# Patient Record
Sex: Male | Born: 1935 | Race: White | Hispanic: No | Marital: Married | Smoking: Former smoker
Health system: Southern US, Community
[De-identification: ages and names within clinical notes are randomized; demographics above are authoritative.]

## PROBLEM LIST (undated history)

## (undated) DIAGNOSIS — I6529 Occlusion and stenosis of unspecified carotid artery: Secondary | ICD-10-CM

## (undated) DIAGNOSIS — R209 Unspecified disturbances of skin sensation: Secondary | ICD-10-CM

## (undated) DIAGNOSIS — K219 Gastro-esophageal reflux disease without esophagitis: Secondary | ICD-10-CM

## (undated) DIAGNOSIS — I951 Orthostatic hypotension: Secondary | ICD-10-CM

## (undated) DIAGNOSIS — N200 Calculus of kidney: Secondary | ICD-10-CM

## (undated) DIAGNOSIS — M109 Gout, unspecified: Secondary | ICD-10-CM

## (undated) DIAGNOSIS — M7040 Prepatellar bursitis, unspecified knee: Secondary | ICD-10-CM

## (undated) DIAGNOSIS — J3489 Other specified disorders of nose and nasal sinuses: Secondary | ICD-10-CM

## (undated) DIAGNOSIS — I2510 Atherosclerotic heart disease of native coronary artery without angina pectoris: Secondary | ICD-10-CM

## (undated) DIAGNOSIS — R42 Dizziness and giddiness: Secondary | ICD-10-CM

## (undated) DIAGNOSIS — N183 Chronic kidney disease, stage 3 (moderate): Secondary | ICD-10-CM

## (undated) DIAGNOSIS — I739 Peripheral vascular disease, unspecified: Secondary | ICD-10-CM

## (undated) DIAGNOSIS — J8410 Pulmonary fibrosis, unspecified: Secondary | ICD-10-CM

## (undated) DIAGNOSIS — E079 Disorder of thyroid, unspecified: Secondary | ICD-10-CM

## (undated) DIAGNOSIS — Z96649 Presence of unspecified artificial hip joint: Secondary | ICD-10-CM

## (undated) DIAGNOSIS — Q674 Other congenital deformities of skull, face and jaw: Secondary | ICD-10-CM

## (undated) DIAGNOSIS — I209 Angina pectoris, unspecified: Secondary | ICD-10-CM

## (undated) DIAGNOSIS — M79609 Pain in unspecified limb: Secondary | ICD-10-CM

## (undated) DIAGNOSIS — R072 Precordial pain: Secondary | ICD-10-CM

## (undated) DIAGNOSIS — I10 Essential (primary) hypertension: Secondary | ICD-10-CM

## (undated) DIAGNOSIS — G4700 Insomnia, unspecified: Secondary | ICD-10-CM

## (undated) DIAGNOSIS — C801 Malignant (primary) neoplasm, unspecified: Secondary | ICD-10-CM

## (undated) DIAGNOSIS — Z87898 Personal history of other specified conditions: Secondary | ICD-10-CM

## (undated) DIAGNOSIS — E785 Hyperlipidemia, unspecified: Secondary | ICD-10-CM

## (undated) DIAGNOSIS — D649 Anemia, unspecified: Secondary | ICD-10-CM

## (undated) HISTORY — DX: Angina pectoris, unspecified: I20.9

## (undated) HISTORY — DX: Personal history of other specified conditions: Z87.898

## (undated) HISTORY — DX: Unspecified disturbances of skin sensation: R20.9

## (undated) HISTORY — DX: Pain in unspecified limb: M79.609

## (undated) HISTORY — DX: Other specified disorders of nose and nasal sinuses: J34.89

## (undated) HISTORY — DX: Calculus of kidney: N20.0

## (undated) HISTORY — DX: Orthostatic hypotension: I95.1

## (undated) HISTORY — DX: Atherosclerotic heart disease of native coronary artery without angina pectoris: I25.10

## (undated) HISTORY — DX: Presence of unspecified artificial hip joint: Z96.649

## (undated) HISTORY — DX: Chronic kidney disease, stage 3 (moderate): N18.3

## (undated) HISTORY — DX: Precordial pain: R07.2

## (undated) HISTORY — DX: Pulmonary fibrosis, unspecified: J84.10

## (undated) HISTORY — DX: Gout, unspecified: M10.9

## (undated) HISTORY — DX: Peripheral vascular disease, unspecified: I73.9

## (undated) HISTORY — DX: Other congenital deformities of skull, face and jaw: Q67.4

## (undated) HISTORY — DX: Occlusion and stenosis of unspecified carotid artery: I65.29

## (undated) HISTORY — DX: Other disorders of calcium metabolism: E83.59

## (undated) HISTORY — DX: Hyperlipidemia, unspecified: E78.5

## (undated) HISTORY — DX: Gastro-esophageal reflux disease without esophagitis: K21.9

## (undated) HISTORY — DX: Dizziness and giddiness: R42

## (undated) HISTORY — DX: Insomnia, unspecified: G47.00

## (undated) HISTORY — DX: Disorder of thyroid, unspecified: E07.9

## (undated) HISTORY — DX: Prepatellar bursitis, unspecified knee: M70.40

## (undated) HISTORY — DX: Anemia, unspecified: D64.9

## (undated) HISTORY — DX: Essential (primary) hypertension: I10

---

## 1978-08-23 HISTORY — PX: TOTAL HIP ARTHROPLASTY: SHX124

## 1979-03-29 DIAGNOSIS — Z96649 Presence of unspecified artificial hip joint: Secondary | ICD-10-CM

## 1979-03-29 DIAGNOSIS — N200 Calculus of kidney: Secondary | ICD-10-CM

## 1979-03-29 HISTORY — DX: Presence of unspecified artificial hip joint: Z96.649

## 1979-03-29 HISTORY — DX: Calculus of kidney: N20.0

## 1992-08-23 HISTORY — PX: CORONARY ARTERY BYPASS GRAFT: SHX141

## 1993-03-28 DIAGNOSIS — I2510 Atherosclerotic heart disease of native coronary artery without angina pectoris: Secondary | ICD-10-CM

## 1993-03-28 HISTORY — DX: Atherosclerotic heart disease of native coronary artery without angina pectoris: I25.10

## 2001-11-30 ENCOUNTER — Encounter: Payer: Self-pay | Admitting: Internal Medicine

## 2001-11-30 ENCOUNTER — Encounter: Admission: RE | Admit: 2001-11-30 | Discharge: 2001-11-30 | Payer: Self-pay | Admitting: Internal Medicine

## 2001-12-14 ENCOUNTER — Encounter: Admission: RE | Admit: 2001-12-14 | Discharge: 2002-03-14 | Payer: Self-pay | Admitting: Internal Medicine

## 2002-04-13 ENCOUNTER — Encounter: Payer: Self-pay | Admitting: Emergency Medicine

## 2002-04-13 ENCOUNTER — Inpatient Hospital Stay (HOSPITAL_COMMUNITY): Admission: EM | Admit: 2002-04-13 | Discharge: 2002-04-14 | Payer: Self-pay | Admitting: Emergency Medicine

## 2003-08-14 ENCOUNTER — Emergency Department (HOSPITAL_COMMUNITY): Admission: EM | Admit: 2003-08-14 | Discharge: 2003-08-14 | Payer: Self-pay | Admitting: Emergency Medicine

## 2003-08-26 ENCOUNTER — Ambulatory Visit (HOSPITAL_COMMUNITY): Admission: RE | Admit: 2003-08-26 | Discharge: 2003-08-26 | Payer: Self-pay | Admitting: Cardiovascular Disease

## 2003-12-02 ENCOUNTER — Encounter: Admission: RE | Admit: 2003-12-02 | Discharge: 2003-12-02 | Payer: Self-pay | Admitting: *Deleted

## 2005-01-06 ENCOUNTER — Inpatient Hospital Stay (HOSPITAL_COMMUNITY): Admission: RE | Admit: 2005-01-06 | Discharge: 2005-01-09 | Payer: Self-pay | Admitting: Orthopaedic Surgery

## 2005-11-19 ENCOUNTER — Emergency Department (HOSPITAL_COMMUNITY): Admission: EM | Admit: 2005-11-19 | Discharge: 2005-11-19 | Payer: Self-pay | Admitting: Family Medicine

## 2007-02-09 ENCOUNTER — Encounter: Admission: RE | Admit: 2007-02-09 | Discharge: 2007-02-09 | Payer: Self-pay | Admitting: Internal Medicine

## 2007-02-23 ENCOUNTER — Ambulatory Visit: Payer: Self-pay | Admitting: Internal Medicine

## 2007-02-27 ENCOUNTER — Ambulatory Visit: Payer: Self-pay | Admitting: Cardiovascular Disease

## 2009-05-14 ENCOUNTER — Ambulatory Visit (HOSPITAL_COMMUNITY): Admission: RE | Admit: 2009-05-14 | Discharge: 2009-05-14 | Payer: Self-pay | Admitting: Cardiology

## 2009-05-14 ENCOUNTER — Encounter (INDEPENDENT_AMBULATORY_CARE_PROVIDER_SITE_OTHER): Payer: Self-pay | Admitting: Cardiology

## 2009-05-14 ENCOUNTER — Ambulatory Visit: Payer: Self-pay | Admitting: Cardiovascular Disease

## 2009-05-14 ENCOUNTER — Inpatient Hospital Stay (HOSPITAL_COMMUNITY): Admission: AD | Admit: 2009-05-14 | Discharge: 2009-05-17 | Payer: Self-pay | Admitting: Cardiology

## 2009-05-14 DIAGNOSIS — I209 Angina pectoris, unspecified: Secondary | ICD-10-CM

## 2009-05-14 HISTORY — DX: Angina pectoris, unspecified: I20.9

## 2009-05-16 HISTORY — PX: CORONARY ANGIOPLASTY WITH STENT PLACEMENT: SHX49

## 2009-10-23 DIAGNOSIS — R42 Dizziness and giddiness: Secondary | ICD-10-CM

## 2009-10-23 HISTORY — DX: Dizziness and giddiness: R42

## 2009-10-27 ENCOUNTER — Emergency Department (HOSPITAL_COMMUNITY): Admission: EM | Admit: 2009-10-27 | Discharge: 2009-10-27 | Payer: Self-pay | Admitting: Emergency Medicine

## 2009-10-27 DIAGNOSIS — I951 Orthostatic hypotension: Secondary | ICD-10-CM

## 2009-10-27 HISTORY — DX: Orthostatic hypotension: I95.1

## 2010-11-15 LAB — BASIC METABOLIC PANEL
BUN: 28 mg/dL — ABNORMAL HIGH (ref 6–23)
CO2: 28 mEq/L (ref 19–32)
Calcium: 9.4 mg/dL (ref 8.4–10.5)
Chloride: 99 mEq/L (ref 96–112)
GFR calc non Af Amer: 43 mL/min — ABNORMAL LOW (ref >60)
Glucose, Bld: 92 mg/dL (ref 70–99)

## 2010-11-15 LAB — DIFFERENTIAL
Basophils Relative: 0 % (ref 0–1)
Eosinophils Absolute: 0.4 10*3/uL (ref 0.0–0.7)
Monocytes Absolute: 1.7 10*3/uL — ABNORMAL HIGH (ref 0.1–1.0)
Neutro Abs: 5 10*3/uL (ref 1.7–7.7)

## 2010-11-15 LAB — CBC
HCT: 36.6 % — ABNORMAL LOW (ref 39.0–52.0)
Hemoglobin: 12.5 g/dL — ABNORMAL LOW (ref 13.0–17.0)
RBC: 3.95 MIL/uL — ABNORMAL LOW (ref 4.22–5.81)
RDW: 15.1 % (ref 11.5–15.5)

## 2010-11-15 LAB — POCT CARDIAC MARKERS
Myoglobin, poc: 79.9 ng/mL (ref 12–200)
Troponin i, poc: <0.05 ng/mL (ref 0.00–0.09)

## 2010-11-27 LAB — BASIC METABOLIC PANEL
BUN: 24 mg/dL — ABNORMAL HIGH (ref 6–23)
CO2: 26 mEq/L (ref 19–32)
Calcium: 8.8 mg/dL (ref 8.4–10.5)
Calcium: 8.8 mg/dL (ref 8.4–10.5)
Chloride: 105 mEq/L (ref 96–112)
Chloride: 105 mEq/L (ref 96–112)
Creatinine, Ser: 1.59 mg/dL — ABNORMAL HIGH (ref 0.4–1.5)
Creatinine, Ser: 1.83 mg/dL — ABNORMAL HIGH (ref 0.4–1.5)
GFR calc Af Amer: 51 mL/min — ABNORMAL LOW (ref >60)
GFR calc non Af Amer: 42 mL/min — ABNORMAL LOW (ref >60)
GFR calc non Af Amer: 42 mL/min — ABNORMAL LOW (ref >60)
Glucose, Bld: 110 mg/dL — ABNORMAL HIGH (ref 70–99)
Glucose, Bld: 115 mg/dL — ABNORMAL HIGH (ref 70–99)
Glucose, Bld: 92 mg/dL (ref 70–99)
Sodium: 137 mEq/L (ref 135–145)
Sodium: 141 mEq/L (ref 135–145)

## 2010-11-27 LAB — CBC
HCT: 33.2 % — ABNORMAL LOW (ref 39.0–52.0)
Hemoglobin: 10.4 g/dL — ABNORMAL LOW (ref 13.0–17.0)
MCV: 98.2 fL (ref 78.0–100.0)
Platelets: 169 10*3/uL (ref 150–400)
Platelets: 190 10*3/uL (ref 150–400)
RBC: 3.07 MIL/uL — ABNORMAL LOW (ref 4.22–5.81)
RDW: 14.2 % (ref 11.5–15.5)

## 2010-11-27 LAB — APTT: aPTT: 24 seconds (ref 24–37)

## 2010-11-27 LAB — PROTIME-INR: INR: 1 (ref 0.00–1.49)

## 2011-01-05 NOTE — Assessment & Plan Note (Signed)
[facility]                             PULMONARY OFFICE NOTE   NAME:Luis Barker, Luis Barker                   MRN:          [id number]  DATE:02/23/2007                            DOB:          1925/10/12    HISTORY:  This is a 77 year old white male with a chronic cough and  dyspnea dating back about five years, for which he underwent a chest x-  ray suggesting mild fibrotic changes in the left greater than right face  and was seen at Dr. Carolee Rota request. He denies any myalgias,  arthralgias, or change in his symptoms over the last several years, or  previous exposure to asbestos, known pulmonary toxins, or Amiodarone.   PAST MEDICAL HISTORY:  Significant for hypertension, hyperlipidemia, and  remote ischemic heart disease status post remote CABG.   ALLERGIES:  SULFA causes rash with itching.   MEDICATIONS:  1. Lipitor.  2. Benicar.  3. Vitamin D.  4. Aspirin.  5. Fish oil.  6. Multivitamins.   Please see face sheet column dated 02/23/2007 for details.   SOCIAL HISTORY:  He quit smoking in 1960. He is retired with no usual  travel, pet, or hobby exposure.   FAMILY HISTORY:  Negative for respiratory disease or rheumatologic  disorders, and is taken in detail on the worksheet with no additional  findings.   REVIEW OF SYSTEMS:  Also taken in detail on the worksheet with  additional complaints.   PHYSICAL EXAMINATION:  GENERAL:  This is a pleasant stoic ambulatory  white male in no acute distress.  VITAL SIGNS:  Stable.  HEENT:  Unremarkable. Oropharynx is clear. Dentition is intact. Nasal  turbinates normal. Ear canals clear bilaterally.  NECK:  Supple without cervical adenopathy, trachea is midline, no  thyromegaly.  LUNGS:  Perfectly clear bilaterally to auscultation except for subtle  crackles in the left greater than right base. There is no cough however  on inspiratory maneuvers.  CARDIAC:  Regular rate and rhythm with no murmurs, rubs,  or gallops.  ABDOMEN:  Soft, benign with no palpable organomegaly, mass, or  tenderness.  EXTREMITIES:  Warm without calf tenderness, cyanosis, clubbing, or  edema.   Hemoglobin saturation was 97% on room air.   Chest x-ray from 6/16 showed cardiomegaly with minimal increase of  markings at the base.   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 He has no clubbing or  progressive symptoms to suggest UIP or rheumatologic symptoms to suggest  collagen vascular disease.   I did recommend a CT scan of the chest and PFTs to quantify the  problem, and will probably need to repeat both the plain film and PFTs  at six months to make sure there has been no progression.   I had a long discussion with the patient about the findings, but  emphasized that it is very unlikely he has the typical form of pulmonary  fibrosis that  would portend a significant impact on his longevity, or  for that matter, quality of life.     Charlaine Dalton. Sherene Sires, MD, [facility]  Electronically Signed    MBW/MedQ  DD: 02/23/2007  DT: 02/24/2007  Job #: [id number]   cc:   Soyla Murphy. Renne Crigler, M.D.

## 2011-01-08 NOTE — Discharge Summary (Signed)
NAME:  Luis Barker, Luis Barker            ACCOUNT NO.:  [id number]   MEDICAL RECORD NO.:  [id number]          PATIENT TYPE:  INP   LOCATION:  5022                         FACILITY:  [facility]   PHYSICIAN:  Mark C. Ophelia Charter, M.D.    DATE OF BIRTH:  05-Nov-1925   DATE OF ADMISSION:  01/06/2005  DATE OF DISCHARGE:  01/09/2005                                 DISCHARGE SUMMARY   FINAL DIAGNOSIS:  Painful, loose total hip arthroplasty with polyethylene  debris, a loop recorder left chest wall.   PROCEDURE:  Left total hip arthroplasty revision, poly and ball change and  removal of left chest subcutaneous loop recorder. Surgeon was [employer]. Ophelia Charter,  M.D. Anesthesia was GOT, 100 cc.   This 77 year old male had left total hip arthroplasty done in 1991 with  polyethylene wear which was followed for the last five years treated with  Fosamax. He has had intermittent symptoms with progression by radiographs of  polyethylene wear with prosthesis and polymer. He was brought in for  polyethylene and ball exchange. I was asked by the patient's cardiologist,  Dr. Aleen Campi, to remove the loop recorder while he is under general  anesthesia which is the battery powered implantable Holter monitor type  device that had a dead battery and showed no arrhythmia problems.  This was  at his cardiologist's request. The patient was permitted for this after  appropriate hospital administration was notified of the planned procedure at  the request of cardiologist. The patient's preoperative labs included normal  CBC. PT and PTT were normal.  BUN was 24, creatinine 1.7.  The patient had  some borderline creatinine elevation in the past. He was taken to the  operating room and underwent revision of the polyethylene liner and ball,  followed by closure and then removal of the chest loop recording device.  Postoperatively he was seen by OT, PT, care management, pharmacy, and DVT  prophylaxis. He made excellent progress and had no  problems with the chest  wall or hip incisions, and was discharged on Jan 09, 2005, and also to  follow up in one week with Dr. Ophelia Charter for suture removal.   CONDITION ON DISCHARGE:  Stable.       MCY/MEDQ  D:  03/28/2005  T:  03/28/2005  Job:  [id number]

## 2011-01-08 NOTE — H&P (Signed)
NAME:  Luis Barker, Luis Barker                      ACCOUNT NO.:  [id number]   MEDICAL RECORD NO.:  [id number]                   PATIENT TYPE:  EMS   LOCATION:  MAJO                                 FACILITY:  [facility]   PHYSICIAN:  Christiana Fuchs, MD             DATE OF BIRTH:  1926-07-30   DATE OF ADMISSION:  04/13/2002  DATE OF DISCHARGE:                                HISTORY & PHYSICAL   CHIEF COMPLAINT:  Syncope.   HISTORY OF PRESENT ILLNESS:  The patient was at a restaurant, had consumed  two alcoholic drinks which is a usual pattern for him, and while sitting at  the table preparing to eat dinner he passed out and next recalls waking in  the ambulance.  Family members who were with the patient during this episode  noticed he became very pale and diaphoretic, closed his eyes and slumped  forward although did not fall to the table.  They report that he remained  sitting in his chair during this episode.  Intermittently he would arch his  neck back and make a deep sound similar to as if he were snoring.  Family  members present clearly state that there were no tonic or clonic movements  identified.  There was no incontinence noted.  The episode of  unresponsiveness lasted for approximately 5-10 minutes and resolved soon  after arrival of EMS at the scene.  A bystander present at the restaurant  had some medical training and was able to feel the patient's pulse and  reported this to be normal throughout this whole episode.   The patient, himself, states that just prior to the development of this  episode of syncope he had an episode of periumbilical pain which he  describes as minor without evidence of radiation.  He noted no palpitation.  There was no shortness of breath.  There was no nausea.  There was no  vomiting.  He also states that there are no prior similar occurrences.  The  EMS records show that the patient had good vitals upon their arrival at the  scene with a pulse  rate of 70 with sinus rhythm showing on the monitor.  The  patient's BP was noted to be 126/64.  It appears the patient was  uncommunicative during his stay at the restaurant throughout this episode  and began to communicate with EMS personnel during the ambulance ride to the  hospital.  Presently he is alert and oriented and is without any complaints.   The patient states that his only difference in his medications was he  started a new medication today.  He reports this to be Restoril 15 mg which  he took at approximately 2 p.m.  The event itself occurred at approximately  7:30 to 7:45 p.m.  In the intervening time he did have two glasses of  alcohol.   PAST MEDICAL HISTORY:  1. 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  2. Osteoporosis.  3. Coronary artery disease.  4. Status post coronary artery bypass grafting.  5. Status post left hip replacement greater than 10 years ago.   MEDICATIONS:  1. Lipitor 10 mg p.o. q.d.  2. Fosamax 70 mg p.o. q.weekly.  3. Naproxen 500 mg p.o. q.d.  4. Restoril 15 mg 1 tablet taken today.   SOCIAL HISTORY:  The patient does not smoke.  He takes an occasional  alcoholic drink.  He walks approximately one and one-fourth miles six days  of the week.   ALLERGIES:  SULFA DRUGS.   PHYSICAL EXAMINATION:  VITAL SIGNS:  The patient is presently afebrile with  a BP of 126/64, pulse rate was 70 with a respiratory rate of 14.  Capillary  blood glucose was 127.  HEENT:  Pupils equal, round and reactive to light and accommodation.  Extraocular movements were intact.  Mucosa are moist.  NECK:  Supple without evidence of JVD or bruit.  CARDIAC:  S1 and S2 are normally heard.  There is no rub, gallop or murmur.  CHEST:  Luis Barker to auscultation.  There is some decreased air movement with  occasional crackles heard in the left lower lobe.  ABDOMEN:  Benign.  There is no abdominal tenderness, rigidity, guarding or  rebound.  Bowel sounds are  normally heard.  EXTREMITIES:  No pedal edema.  NEUROLOGIC:  Cranial nerves are intact.  Plantars are down going  bilaterally.  The patient has 5/5 motor strength in all extremities.  The  patient is fluent.   LABORATORY DATA:  CBC reveals white count of 8.9, hemoglobin 13.8.  Basic  metabolic panel reveals sodium 161, creatinine 1.5, potassium 4.2.  CK MB is  found to be 1.7 with troponin of 0.00.   RADIOLOGICAL INVESTIGATIONS:  Chest x-ray Luis Barker; final report pending.   EKG:  Right bundle branch block.  No old one to compare.   ASSESSMENT/PLAN:  Syncope.  Most likely multifactorial secondary to a  combination of alcohol combined with vasovagal episode and initial  unexpected response to Restoril.  However, in the presence of known coronary  artery disease as well as this vague abdominal pain in the periumbilical  region, will need to rule out for any cardiac cause of this.  Will check  serial cardiac enzymes as well as troponin.  Will check EKG or repeat EKG in  the morning.  If this initial workup is negative, will consider discharge to  home with further evaluation with an outpatient stress test.  Will initiate  treatment using aspirin 325 mg.  Will consider addition of beta blocker in a  patient with known coronary artery disease once the patient's stress test is  completed.  Will need to consider use of ACE inhibitor if the patient does  have events of any impaired ejection fraction.                                               Christiana Fuchs, MD    AR/MEDQ  D:  04/13/2002  T:  04/16/2002  Job:  [id number]

## 2011-01-08 NOTE — Discharge Summary (Signed)
NAME:  Luis Barker, Luis Barker NO.:  [id number]   MEDICAL RECORD NO.:  [id number]                   PATIENT TYPE:  INP   LOCATION:  4742                                 FACILITY:  [facility]   PHYSICIAN:  Juline Patch, MD                    DATE OF BIRTH:  26-Jul-1926   DATE OF ADMISSION:  04/13/2002  DATE OF DISCHARGE:  04/14/2002                                 DISCHARGE SUMMARY   HISTORY OF PRESENT ILLNESS:  This is a 77 year old white male who was  admitted by Eliezer Champagne, M.D.,  on April 13, 2002, for an episode  of syncope after eating dinner at a restaurant.  The patient stated that he  passed out after feeling an upset stomach.  He denies any chest pain.  The  patient has a history of coronary artery bypass graft and is followed by Dr.  Donnie Aho.  The patient denies any palpitations, shortness of breath, nausea or  vomiting.  He passed out for five to 10 minutes and woke up in the emergency  room.  There was no tonic clonic movements or incontinence. He was not  confused after the episode.  The patient states that he did not drink a lot  of fluids that day.  He drank two glasses of alcohol during dinner and also  was started on Flomax for the first time that day.   The patient was admitted to telemetry and had CIP and troponins that ruled  out for MI.  He was observed on telemetry where his average heart rate was  in the 50s and 60s.  He denied any further chest pain and nitroglycerin  patch was removed from his chest four hours prior to discharge where he did  not have any chest pain.  On EKG there was a right bundle branch block and  it is questioned whether this is old or new.  His other laboratory values  were within normal limits, however, he did have a creatinine of 1.5 and we  are not sure if this is a new finding.   DISCHARGE MEDICATIONS:  1. Lipitor 10 mg.  2. Enteric coated aspirin 325 mg once a day.  3. Nitroglycerin sublingual p.r.n.  as needed.   DISCHARGE INSTRUCTIONS:  We encouraged him to have increased fluid intake  and not to drive until seen by [place] in our office this week.  [place] Tylenol for  pain if warranted and having increasing reflux he should see [place] in the  office for a trial of PPI or H2 blockers.  I instructed him to follow up  with Victorino Dike ______, or Dr. Lendell Caprice this week and if indicated, he may  need a stress test.  However, he did have a treadmill stress test six months  ago and had no ischemic changes.   DISCHARGE DIAGNOSIS:  Syncope secondary to multiple causes:  dehydration,  Flomax, and  vasovagal.                                               Juline Patch, MD    RP/MEDQ  D:  04/14/2002  T:  04/17/2002  Job:  [id number]   cc:   W. Ashley Royalty., M.D.   Sharlotte Alamo, M.D.  Anette Guarneri

## 2011-01-08 NOTE — Cardiovascular Report (Signed)
NAME:  Luis Barker, Luis Barker                      ACCOUNT NO.:  [id number]   MEDICAL RECORD NO.:  [id number]                   PATIENT TYPE:  OIB   LOCATION:  2854                                 FACILITY:  [facility]   PHYSICIAN:  Aram Candela. Tysinger, M.D.              DATE OF BIRTH:  [birth year]   DATE OF PROCEDURE:  08/26/2003  DATE OF DISCHARGE:                              CARDIAC CATHETERIZATION   PROCEDURES:  Insertion of loop recorder.   INDICATION FOR PROCEDURES:  Syncope x2.   PROCEDURE:  After signing an informed consent, the patient was premedicated  with 5 mg of Valium by mouth and brought to the cardiac catheterization lab.  His anterior chest and base of neck were prepped and draped in sterile  fashion and a left parasternal transverse area was anesthetized locally with  1% lidocaine.  An incision was made in this anesthetized plane with the  incision being deepened into the fascial layer.  A small pocket was then  formed for insertion of the loop recorder which was then inserted without  difficulty.  It was sutured in place using 2-0 silk.  After the loop  recorder was in place, the wound was closed in layers using 2-0 Dexon.  Final skin closure was obtained with a cutaneous layer of Steri-Strips.  The  patient tolerated the procedure well and no complications were noted at the  end of the procedure.  A sterile bulky dressing was applied to the wound and  he was returned to the short stay unit for monitoring and one further dose  of IV antibiotic before discharge later today.                                               John R. Aleen Campi, M.D.    JRT/MEDQ  D:  08/26/2003  T:  08/26/2003  Job:  [id number]

## 2011-01-08 NOTE — Op Note (Signed)
NAME:  Luis Barker, Luis Barker            ACCOUNT NO.:  [id number]   MEDICAL RECORD NO.:  [id number]          PATIENT TYPE:  INP   LOCATION:  5022                         FACILITY:  [facility]   PHYSICIAN:  Mark C. Ophelia Charter, M.D.    DATE OF BIRTH:  1926/04/08   DATE OF PROCEDURE:  01/06/2005  DATE OF DISCHARGE:  01/09/2005                                 OPERATIVE REPORT   PREOPERATIVE DIAGNOSES:  1.  Left painful total hip arthroplasty with polyethylene debris.  2.  Painful loop recorder in chest wall with dead battery.   OPERATION/PROCEDURE:  1.  Left total hip arthroplasty revision, polyethylene liner and ball      exchange.  2.  Removal of left chest wall loop subcutaneous recorder.   SURGEON:  Mark C. Ophelia Charter, M.D.   ANESTHESIA:  General.   ESTIMATED BLOOD LOSS:  100 mL.   INDICATIONS:  This 77 year old male had been a patient of mine for many  years and had total hip arthroplasty done in 1991.  He has had progressive  polyethylene wear and treated with Fosamax and has had increased pain with  eccentric wear.  In the meantime he had problems with arrhythmia.  Had  Holter monitor which was negative and then had implanted loop recorder  device in the chest wall which is ready for removal.  Dr. Aleen Campi, his  cardiologist, asked if I could remove this at the time of his arthroplasty  to prevent a second procedure for the patient.   DESCRIPTION OF PROCEDURE:  After induction of general anesthesia orotracheal  intubation, the patient was placed in the lateral position.  The left hip  was prepped and draped in the usual fashion with preoperative antibiotics.  The old incision was opened.  Posterior approach was made.  There was  significant tissue present from the eccentric polyethylene wear, typical in  appearance which was suctioned.  Cultures were obtained. There was no  evidence of infection.  Polyethylene showed eccentric wear.  Polyethylene  was removed from the liner and a new liner  and a new ball was placed.  Stability was checked.  There was excellent hip stability.  Piriformis was  repaired. Tensor fascia was repaired with non-absorbable sutures, 0 Vicryl  in the gluteus maximum fascia, 2-0 subcutaneous tissue, skin stapled  closure.   The patient was then transferred to the supine position.   Chest was prepped over the loop recorder and the old scar was opened after  area had been squared with towels.  Sterile skin marker was used on the  skin.  Once the skin was opened, the loop recorder popped out.  The wound  was irrigated and subcutaneous tissue was irrigated and then closed with  good hemostasis.   The patient had a knee immobilizer applied.  Leg lengths were equal.  He was  transferred to the recovery room where x-ray demonstrated good position of  the hip.      Mark C. Ophelia Charter, M.D.  Electronically Signed     MCY/MEDQ  D:  04/21/2005  T:  04/22/2005  Job:  [id number]

## 2012-02-04 DIAGNOSIS — M7040 Prepatellar bursitis, unspecified knee: Secondary | ICD-10-CM

## 2012-02-04 HISTORY — DX: Prepatellar bursitis, unspecified knee: M70.40

## 2012-03-27 DIAGNOSIS — D649 Anemia, unspecified: Secondary | ICD-10-CM

## 2012-03-27 DIAGNOSIS — R072 Precordial pain: Secondary | ICD-10-CM

## 2012-03-27 DIAGNOSIS — E785 Hyperlipidemia, unspecified: Secondary | ICD-10-CM

## 2012-03-27 DIAGNOSIS — J8410 Pulmonary fibrosis, unspecified: Secondary | ICD-10-CM

## 2012-03-27 DIAGNOSIS — N183 Chronic kidney disease, stage 3 unspecified: Secondary | ICD-10-CM

## 2012-03-27 DIAGNOSIS — I10 Essential (primary) hypertension: Secondary | ICD-10-CM | POA: Insufficient documentation

## 2012-03-27 HISTORY — DX: Hyperlipidemia, unspecified: E78.5

## 2012-03-27 HISTORY — DX: Pulmonary fibrosis, unspecified: J84.10

## 2012-03-27 HISTORY — DX: Chronic kidney disease, stage 3 unspecified: N18.30

## 2012-03-27 HISTORY — DX: Essential (primary) hypertension: I10

## 2012-03-27 HISTORY — DX: Precordial pain: R07.2

## 2012-03-27 HISTORY — DX: Anemia, unspecified: D64.9

## 2012-03-28 DIAGNOSIS — M79609 Pain in unspecified limb: Secondary | ICD-10-CM

## 2012-03-28 DIAGNOSIS — E079 Disorder of thyroid, unspecified: Secondary | ICD-10-CM

## 2012-03-28 DIAGNOSIS — M109 Gout, unspecified: Secondary | ICD-10-CM

## 2012-03-28 HISTORY — DX: Pain in unspecified limb: M79.609

## 2012-03-28 HISTORY — DX: Other disorders of calcium metabolism: E83.59

## 2012-03-28 HISTORY — DX: Gout, unspecified: M10.9

## 2012-03-28 HISTORY — DX: Disorder of thyroid, unspecified: E07.9

## 2012-05-16 DIAGNOSIS — I6529 Occlusion and stenosis of unspecified carotid artery: Secondary | ICD-10-CM

## 2012-05-16 HISTORY — DX: Occlusion and stenosis of unspecified carotid artery: I65.29

## 2012-08-01 ENCOUNTER — Other Ambulatory Visit: Payer: Self-pay | Admitting: Internal Medicine

## 2012-08-01 DIAGNOSIS — I739 Peripheral vascular disease, unspecified: Secondary | ICD-10-CM | POA: Insufficient documentation

## 2012-08-01 DIAGNOSIS — M545 Low back pain: Secondary | ICD-10-CM

## 2012-08-01 DIAGNOSIS — R209 Unspecified disturbances of skin sensation: Secondary | ICD-10-CM | POA: Insufficient documentation

## 2012-08-01 DIAGNOSIS — G4700 Insomnia, unspecified: Secondary | ICD-10-CM | POA: Insufficient documentation

## 2012-08-01 HISTORY — DX: Insomnia, unspecified: G47.00

## 2012-08-01 HISTORY — DX: Unspecified disturbances of skin sensation: R20.9

## 2012-08-01 HISTORY — DX: Peripheral vascular disease, unspecified: I73.9

## 2012-08-07 ENCOUNTER — Other Ambulatory Visit: Payer: Self-pay

## 2012-08-07 ENCOUNTER — Ambulatory Visit
Admission: RE | Admit: 2012-08-07 | Discharge: 2012-08-07 | Disposition: A | Discharge status: Home / Self Care | Payer: Medicare Other | Source: Ambulatory Visit | Attending: Internal Medicine | Admitting: Internal Medicine

## 2012-08-07 DIAGNOSIS — M545 Low back pain: Secondary | ICD-10-CM

## 2012-08-09 ENCOUNTER — Other Ambulatory Visit: Payer: Self-pay

## 2012-12-26 ENCOUNTER — Encounter: Payer: Self-pay | Admitting: Internal Medicine

## 2012-12-26 ENCOUNTER — Non-Acute Institutional Stay: Payer: Medicare Other | Admitting: Internal Medicine

## 2012-12-26 VITALS — BP 110/58 | HR 60 | Wt 168.0 lb

## 2012-12-26 DIAGNOSIS — I739 Peripheral vascular disease, unspecified: Secondary | ICD-10-CM

## 2012-12-26 DIAGNOSIS — R209 Unspecified disturbances of skin sensation: Secondary | ICD-10-CM

## 2012-12-26 DIAGNOSIS — I10 Essential (primary) hypertension: Secondary | ICD-10-CM

## 2012-12-26 DIAGNOSIS — G4700 Insomnia, unspecified: Secondary | ICD-10-CM

## 2012-12-26 DIAGNOSIS — E785 Hyperlipidemia, unspecified: Secondary | ICD-10-CM

## 2012-12-26 NOTE — Addendum Note (Signed)
Addended by: Kimber Relic on: 12/26/2012 10:27 AM   Modules accepted: Orders

## 2012-12-26 NOTE — Patient Instructions (Signed)
Stop amlodipine until next visit.

## 2012-12-26 NOTE — Progress Notes (Signed)
Subjective:    Patient ID: Luis Barker, male    DOB: June 28, 1926, 79 y.o.   MRN: [id number]  HPI Peripheral vascular disease, unspecified  ABI testing was normal in December 2013 Insomnia, unspecified  Continues to have issues with not getting enough sleep. He goes to bed about 9 PM . Patient says he falls asleep fine. He then gets up about midnight to 1 AM to void. He is able to fall back asleep within 30-45 minutes. He sleeps until 6 AM. Disturbance of skin sensation  Paresthesias of the scan are improved. 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 Today it was 110/58. He denies dizziness with standing at this point, but does carry a prior diagnosis of orthostatic hypotension. He is taking amlodipine 5 mg.    Review of Systems  Constitutional: Negative for fever, chills, activity change, appetite change and fatigue.  HENT: Negative.   Eyes: Negative.   Respiratory: Positive for shortness of breath.   Cardiovascular: Positive for leg swelling. Negative for chest pain and palpitations.       History of coronary artery disease. Patient had stents placed.  Gastrointestinal: Negative.   Endocrine: Negative.   Genitourinary: Negative.   Musculoskeletal: Positive for back pain.  Skin: Negative.   Allergic/Immunologic: Negative.   Neurological: Negative for tremors, syncope and weakness.       Altered sensation in feet. 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  Hematological: Negative.   Psychiatric/Behavioral: Positive for sleep disturbance.       Objective:   Physical Exam  Constitutional: He is oriented to person, place, and time. He appears well-developed and well-nourished. No distress.  HENT:  Head:  Normocephalic and atraumatic.  Left Ear: External ear normal.  Nose: Nose normal.  Mouth/Throat: No oropharyngeal exudate.  Eyes: Conjunctivae and EOM are normal. Pupils are equal, round, and reactive to light.  Neck: Normal range of motion. Neck supple. No JVD present. No tracheal deviation present. No thyromegaly present.  Cardiovascular: Normal rate, regular rhythm, normal heart sounds and intact distal pulses.  Exam reveals no gallop and no friction rub.   No murmur heard. Pulmonary/Chest: Effort normal and breath sounds normal. No respiratory distress. He has no wheezes. He has no rales. He exhibits no tenderness.  Abdominal: Soft. Bowel sounds are normal. He exhibits no distension and no mass. There is no tenderness.  Musculoskeletal: Normal range of motion. He exhibits edema. He exhibits no tenderness.  Lymphadenopathy:    He has no cervical adenopathy.  Neurological: He is alert and oriented to person, place, and time. He has normal reflexes. No cranial nerve deficit. Coordination normal.  Skin: Skin is warm and dry. No rash noted. No pallor.  Psychiatric: He has a normal mood and affect. His behavior is normal. Judgment and thought content normal.          Assessment & Plan:     ICD-9-CM  1. Peripheral vascular disease, unspecified Patient had strongly palpable posterior tibial pulses bilaterally and a normal ABI in December 2013  443.9  2. Insomnia, unspecified Advised patient that his total hours of sleep are normal for his age. Continue with temazepam as needed.  780.52  3. Disturbance of skin sensation Polyneuropathy sensations of numbness. Sensory changes  have improved since last visit  782.0  4. Other and unspecified hyperlipidemia Followup lab prior to next visit  272.4  5. Unspecified essential hypertension Discontinued amlodipine  401.9

## 2013-01-30 ENCOUNTER — Other Ambulatory Visit: Payer: Self-pay | Admitting: Internal Medicine

## 2013-03-13 ENCOUNTER — Non-Acute Institutional Stay: Payer: Medicare Other | Admitting: Internal Medicine

## 2013-03-13 ENCOUNTER — Encounter: Payer: Self-pay | Admitting: Internal Medicine

## 2013-03-13 VITALS — BP 118/62 | HR 60 | Temp 97.6°F | Ht 67.0 in | Wt 166.0 lb

## 2013-03-13 DIAGNOSIS — H6121 Impacted cerumen, right ear: Secondary | ICD-10-CM

## 2013-03-13 DIAGNOSIS — H6120 Impacted cerumen, unspecified ear: Secondary | ICD-10-CM

## 2013-03-13 NOTE — Progress Notes (Signed)
  Subjective:    Patient ID: Luis Barker, male    DOB: 1925/12/10, 79 y.o.   MRN: [id number]  HPI Right ear stopped up for the last 4 days.   Review of Systems Left EAC normal. Right EAC occluded with cerumen. Hearing is diminished on the right . No recent URI.    Objective:   Physical Exam  Wax occlusion i the right ear      Assessment & Plan:  cereumen impaction.Removed with forceps and water lavage. Forceps nicked the right EAC causing a small amount of bleeding. TM remains intact.  Applied small tissue ball in EAC following lavage. Advised it is OK to remove in a few hours.

## 2013-03-13 NOTE — Patient Instructions (Signed)
Continue current medications.Remove tissue in ear in a few hours.

## 2013-04-30 ENCOUNTER — Encounter: Payer: Self-pay | Admitting: Cardiology

## 2013-04-30 NOTE — Progress Notes (Signed)
Patient ID: Luis Barker, male   DOB: 10/16/1925, 79 y.o.   MRN: [id number]   Luis, Barker  Date of visit:  04/30/2013 DOB:  March 21, 1926    Age:  87 yrs. Medical record number:  [id number]     Account number:  [id number] Primary Care Provider: GREEN III, ARTHUR G ____________________________ CURRENT DIAGNOSES  1. CAD,Native  2. Stent Placement  3. Chronic Kidney Disease (Stage 3)  4. Hypertension,Essential (Benign)  5. Hyperlipidemia  6. Idiopathic Pulmonary Fibrosis  7. Surgery-Aortocoronary Bypass Grafting  8. Dyspnea  9. GERD ____________________________ ALLERGIES  Sulfa (Sulfonamides), Intolerance-unknown ____________________________ MEDICATIONS  1. Multivitamin Tablet, 1 p.o. daily  2. Aspirin Childrens 81 mg Tablet, Chewable, 1 p.o. daily  3. Prednisone 10 mg Tablet, PRN  4. temazepam 15 mg capsule, QHS  5. atorvastatin 80 mg tablet, 1 p.o. daily  6. nitroglycerin 0.4 mg tablet, sublingual, Take as directed ____________________________ CHIEF COMPLAINTS  Followup of CAD,Native ____________________________ HISTORY OF PRESENT ILLNESS  Patient seen for cardiac followup. He has not been seen here in over 2 years when he canceled his last appointment due to his wife's health. He has been doing well although he is severely limited with peripheral neuropathy which affects his gait. He has had no angina and does not need to use nitroglycerin. He is fairly limited in his physical activity due to his neuropathy and gait. He denies PND, orthopnea, syncope, palpitations, or claudication. He is requesting prescription refills today. ____________________________ PAST HISTORY  Past Medical Illnesses:  hyperlipidemia, GERD, hypertension, gout, BPH, pulmonary fibrosis, chronic kidney disease Stage 3, neuropathy;  Cardiovascular Illnesses:  CAD;  Surgical Procedures:  CABG, tonsillectomy/adenoids, hip replacement;  Cardiology Procedures-Invasive:  cardiac cath (left) September 2010, BMS  stent  mid and distal RCA September 2010 Dr. Rosalie Gums, PTCA LAD with restenosis 1998;  Cardiology Procedures-Noninvasive:  echocardiogram September 2010;  Cardiac Cath Results:  normal Left main, 70% stenosis ostial LAD, occluded proximal LAD, 30% stenosis mid CFX, 70% stenosis mid RCA, 99% stenosis distal RCA, widely patent LAD Diag 1 LIMA graft;  LVEF of 65% documented via echocardiogram on 05/14/2009,   ____________________________ CARDIO-PULMONARY TEST DATES EKG Date:  04/30/2013;   Cardiac Cath Date:  05/15/2009;  CABG: 10/14/1995;  Stent Placement Date: 05/16/2009;  Echocardiography Date: 05/14/2009;  Chest Xray Date: 05/14/2009;   ____________________________ FAMILY HISTORY Brother -- Dementia/Alzheimer's, Deceased Father -- Myocardial infarction, Deceased Mother -- CVA, Deceased ____________________________ SOCIAL HISTORY Alcohol Use:  1 oz gin qd;  Smoking:  never smoked;  Diet:  regular diet;  Lifestyle:  married;  Exercise:  walking;  Occupation:  retired Holiday representative;  Residence:  lives with wife [facility];   ____________________________ REVIEW OF SYSTEMS General:  weight loss of approximately 10 lbs Eyes: denies diplopia, glaucoma or visual field defects. Respiratory: mild dyspnea with exertion Cardiovascular:  please review HPI Abdominal: denies dyspepsia, GI bleeding, constipation, or diarrhea Genitourinary-Male: nocturia  Musculoskeletal:  cervical spondylosis Neurological:  peripheral neuropathy, gait disturbance  ____________________________ PHYSICAL EXAMINATION VITAL SIGNS  Blood Pressure:  120/70 Sitting, Left arm, regular cuff   Pulse:  64/min. Weight:  166.00 lbs. Height:  68"BMI: 25  Constitutional:  pleasant white male in no acute distress Skin:  warm and dry to touch, no apparent skin lesions, or masses noted. Head:  normocephalic, normal hair pattern, no masses or tenderness ENT:  ears, nose and throat reveal no gross abnormalities.  Dentition good. Neck:   supple, without massess. No JVD, thyromegaly or carotid bruits. Carotid upstroke  normal. Chest:  healed median sternotomy scar, normal A-P diameter, fine rales both bases Cardiac:  regular rhythm, normal S1 and S2, No S3 or S4, no murmurs, gallops or rubs detected. Peripheral Pulses:  the femoral,dorsalis pedis, and posterior tibial pulses are full and equal bilaterally with no bruits auscultated. Extremities & Back:  1+ edema right leg, bilateral venous insufficiency changes present Neurological:  no gross motor or sensory deficits noted, affect appropriate, oriented x3. ____________________________ MOST RECENT LIPID PANEL 10/09/10  CHOL TOTL 130 mg/dl, LDL 65 NM, HDL 53 mg/dl, TRIGLYCER 62 mg/dl, ALT 19 u/l, ALK PHOS 61 u/l and AST 24 u/l ____________________________ IMPRESSIONS/PLAN  1. Coronary artery disease with previous bypass grafting and previous stents of the right coronary artery with no angina 2. Hyperlipidemia under treatment 3. Stage III chronic kidney disease 4. Severe peripheral neuropathy  Recommendations:  Refills given for atorvastatin as well as nitroglycerin. Clinically having no angina and is mainly limited with neuropathy. Followup in one year. ____________________________ TODAYS ORDERS  1. Return Visit: 1 year  2. 12 Lead EKG: Today                       ____________________________ Cardiology Physician:  Darden Palmer MD [facility]

## 2013-05-08 ENCOUNTER — Non-Acute Institutional Stay: Payer: Medicare Other | Admitting: Internal Medicine

## 2013-05-08 ENCOUNTER — Encounter: Payer: Self-pay | Admitting: Internal Medicine

## 2013-05-08 VITALS — BP 114/62 | HR 72 | Temp 98.0°F | Ht 67.0 in | Wt 166.0 lb

## 2013-05-08 DIAGNOSIS — J8410 Pulmonary fibrosis, unspecified: Secondary | ICD-10-CM

## 2013-05-08 DIAGNOSIS — M109 Gout, unspecified: Secondary | ICD-10-CM

## 2013-05-08 DIAGNOSIS — M25519 Pain in unspecified shoulder: Secondary | ICD-10-CM

## 2013-05-08 DIAGNOSIS — M25511 Pain in right shoulder: Secondary | ICD-10-CM

## 2013-05-08 DIAGNOSIS — I10 Essential (primary) hypertension: Secondary | ICD-10-CM

## 2013-05-08 DIAGNOSIS — G609 Hereditary and idiopathic neuropathy, unspecified: Secondary | ICD-10-CM

## 2013-05-08 DIAGNOSIS — E785 Hyperlipidemia, unspecified: Secondary | ICD-10-CM

## 2013-05-08 DIAGNOSIS — I2510 Atherosclerotic heart disease of native coronary artery without angina pectoris: Secondary | ICD-10-CM

## 2013-05-08 DIAGNOSIS — M11869 Other specified crystal arthropathies, unspecified knee: Secondary | ICD-10-CM

## 2013-05-08 DIAGNOSIS — R351 Nocturia: Secondary | ICD-10-CM

## 2013-05-08 DIAGNOSIS — M11269 Other chondrocalcinosis, unspecified knee: Secondary | ICD-10-CM

## 2013-05-08 NOTE — Progress Notes (Deleted)
  Subjective:    Patient ID: Luis Barker, male    DOB: May 31, 1926, 79 y.o.   MRN: [id number]  HPI    Review of Systems     Objective:   Physical Exam        Assessment & Plan:

## 2013-05-08 NOTE — Progress Notes (Signed)
Passed clock drawing 

## 2013-05-31 ENCOUNTER — Other Ambulatory Visit: Payer: Self-pay | Admitting: *Deleted

## 2013-05-31 MED ORDER — TEMAZEPAM 15 MG PO CAPS: ORAL_CAPSULE | ORAL | Status: DC

## 2013-06-01 ENCOUNTER — Other Ambulatory Visit: Payer: Self-pay | Admitting: *Deleted

## 2013-06-01 MED ORDER — TEMAZEPAM 15 MG PO CAPS: ORAL_CAPSULE | ORAL | Status: DC

## 2013-06-05 ENCOUNTER — Other Ambulatory Visit: Payer: Self-pay | Admitting: *Deleted

## 2013-06-05 MED ORDER — TEMAZEPAM 15 MG PO CAPS: ORAL_CAPSULE | ORAL | Status: DC

## 2013-07-02 NOTE — Progress Notes (Signed)
Patient ID: Luis Barker, male   DOB: 07-03-1926, 79 y.o.   MRN: [id number] Nursing Home Location:  [facility] [address]   Place of Service: Clinic (12)  PCP: Kimber Relic, MD  Code Status: Living Will   Allergies  Allergen Reactions  . Sulfa Antibiotics     Chief Complaint  Patient presents with  . 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 No dysuria  Pain in joint, shoulder region, right: moderate. Difficult to abduct and rotate at right shoulder      Past Medical History  Diagnosis Date  . Peripheral vascular disease, unspecified 08/01/2012  . Insomnia, unspecified 08/01/2012  . Disturbance of skin sensation 08/01/2012  . Occlusion and stenosis of carotid artery 05/16/2012  . Unspecified disorder of thyroid 03/28/2012  . Gout, unspecified 03/28/2012  . Other disorder of calcium metabolism 03/28/2012  . Pain in limb 03/28/2012  . Other and unspecified hyperlipidemia 03/27/2012  . Anemia, unspecified 03/27/2012  . Unspecified essential hypertension 03/27/2012  . Postinflammatory pulmonary fibrosis 03/27/2012  . Chronic kidney disease, stage III (moderate) 03/27/2012  . Orthostatic hypotension 10/27/2009  . Dizziness and giddiness 10/23/2009  . Other and unspecified angina pectoris 05/14/2009  . Coronary atherosclerosis of native coronary artery 03/28/1993  . Calculus of kidney 03/29/1979  . Hip joint replacement by other means 03/29/1979   . Precordial pain 03/27/2012    Past Surgical History  Procedure Laterality Date  . Total hip arthroplasty Left 1980     Dr. Ophelia Charter  . Coronary angioplasty with stent placement  05/16/2009    Dr Donnie Aho  . Coronary artery bypass graft  1994    CONSULTANTS Cardio: Donnie Aho Cardiovascular: G. Bing Matter; Hecker Neph: Sharyon CableOphelia Charter  PAST PROCEDURES 08/09/2007 Holter: normal 10/28/2010 Spirometry: normal 3/7/20122 Bone density: normal 1998 PTCA LAD ([place]) 05/14/09 2D Echo: LVEF 65%. Hypertensive heart disease. Diastolic dysfunction. Normal left main. 70% stenosis mid RCA. 99% stenosis distal RCA. Widely patent LAD Diag 1 LIMA graft.  Social History: History   Social History  . Marital Status: Married    Spouse Name: N/A    Number of Children: N/A  . Years of Education: N/A   Occupational History  . retired Airline pilot    Social History Main Topics  . Smoking status: Former Smoker    Quit date: 12/27/1982  . Smokeless tobacco: Never Used  . Alcohol Use: No  . Drug Use: No  . Sexual Activity: No   Other Topics Concern  . None   Social History Narrative   Patient [facility] since 2011   Has a living will    Family History Family Status  Relation Status Death Age  . Mother Deceased 13    CVA  . Father Deceased 78    MI  . Brother Deceased 61    Dementia  . Daughter Alive   . Daughter Alive   . Daughter Alive    Family History  Problem Relation Age of Onset  . Stroke Mother   .  Heart disease Father   . Dementia Brother      Medications: Patient's Medications  New Prescriptions   No medications on file  Previous Medications   ATORVASTATIN (LIPITOR) 40 MG TABLET    Take 40 mg by mouth daily. For cholesterol   FAMOTIDINE (PEPCID) 10 MG TABLET    Take 10 mg by mouth. Take one daily as needed for acid control   MULTIPLE VITAMINS-MINERALS ([employer] SPECTRAVITE) TABS    Take 1 tablet by mouth. Daily for vitamin   PREDNISONE (DELTASONE) 10 MG  TABLET    Take 10 mg by mouth daily. Take one daily as needed for gout  Modified Medications   Modified Medication Previous Medication   TEMAZEPAM (RESTORIL) 15 MG CAPSULE temazepam (RESTORIL) 15 MG capsule      Take one capsule by mouth nightly as needed for rest    Take one capsule by mouth nightly as needed for rest  Discontinued Medications   TEMAZEPAM (RESTORIL) 15 MG CAPSULE    TAKE ONE CAPSULE BY MOUTH NIGHTLY IF NEEDED FOR REST    Immunization History  Administered Date(s) Administered  . Influenza Whole 05/23/2012, 05/24/2013  . Pneumococcal Polysaccharide 09/09/2005  . Td 02/06/2007  . Zoster 09/08/2012     Review of Systems  Constitutional: Negative for fever, chills, weight loss and diaphoresis.  HENT: Negative for congestion, ear pain, hearing loss and tinnitus.   Eyes: Negative.   Respiratory: Positive for cough. Negative for sputum production, shortness of breath and wheezing.   Cardiovascular: Negative for chest pain, palpitations and leg swelling.  Gastrointestinal:       Nocturia x 4  Genitourinary: Negative.   Musculoskeletal: Positive for back pain. Negative for myalgias and neck pain.       Occasional pain in the right thigh and click in the right hip.  Skin: Negative for itching and rash.  Neurological: Negative for dizziness, tingling, tremors, sensory change, seizures, loss of consciousness and weakness.  Endo/Heme/Allergies: Bruises/bleeds easily.  Psychiatric/Behavioral: Negative.      Filed Vitals:   05/08/13 1017  BP: 114/62  Pulse: 72  Temp: 98 F (36.7 C)  TempSrc: Oral  Height: 5\' 7"  (1.702 m)  Weight: 166 lb (75.297 kg)   Physical Exam  Constitutional: He is oriented to person, place, and time. He appears well-developed and well-nourished. No distress.  HENT:  Head: Normocephalic and atraumatic.  Right Ear: External ear normal.  Left Ear: External ear normal.  Nose: Nose normal.  Mouth/Throat: Oropharynx is clear and moist.  Eyes:  Conjunctivae and EOM are normal. Pupils are equal, round, and reactive to light.  Neck: Neck supple. No JVD present. No tracheal deviation present.  Cardiovascular: Normal rate, regular rhythm, normal heart sounds and intact distal pulses.  Exam reveals no gallop and no friction rub.   No murmur heard. Pulmonary/Chest: No respiratory distress. He has no wheezes. He exhibits no tenderness.  Abdominal: He exhibits no distension and no mass. There is no tenderness. No hernia.  Genitourinary: Rectum normal, prostate normal and penis normal. Guaiac negative stool. No penile tenderness.  Musculoskeletal: Normal range of motion. He exhibits no edema and no tenderness.  Lymphadenopathy:    He has no cervical adenopathy.  Neurological: He is alert and oriented to person, place, and time. He displays normal reflexes. No cranial nerve deficit. He exhibits normal muscle tone. Coordination normal.  05/08/13 MMSE 30/30. Passed clock drawing.  Skin: No rash noted. No erythema. No pallor.  Psychiatric: He has a normal  mood and affect. His behavior is normal. Judgment and thought content normal.      Labs reviewed: 04/30/13 CMP: glu 94, BUN 22, creat 1.59  Lipids: TC 142, trig 133, HDL 37, LDL 78  Assessment/Plan 1. Unspecified essential hypertension CONTROLLED. Off amlodipine.  2. Other and unspecified hyperlipidemia controlled  3. Gout No recent attacks  4. Peripheral neuropathy mild  5. Chronic kidney disease, stage III (moderate) improved  6. Coronary atherosclerosis of native coronary artery Asymptomatic. Denies angina or palpitations.  7. 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 No active pains.  8. Postinflammatory pulmonary fibrosis Mild. Diagnosis by x-ray report  9. Nocturia He is is not interested in further urologic evaluation at this time  10. Pain in joint, shoulder region, right Mild.

## 2013-07-11 ENCOUNTER — Encounter: Payer: Self-pay | Admitting: Internal Medicine

## 2013-07-11 DIAGNOSIS — I2510 Atherosclerotic heart disease of native coronary artery without angina pectoris: Secondary | ICD-10-CM | POA: Insufficient documentation

## 2013-07-11 DIAGNOSIS — M109 Gout, unspecified: Secondary | ICD-10-CM | POA: Insufficient documentation

## 2013-07-11 DIAGNOSIS — J8410 Pulmonary fibrosis, unspecified: Secondary | ICD-10-CM | POA: Insufficient documentation

## 2013-07-11 DIAGNOSIS — M25519 Pain in unspecified shoulder: Secondary | ICD-10-CM | POA: Insufficient documentation

## 2013-07-11 DIAGNOSIS — M11269 Other chondrocalcinosis, unspecified knee: Secondary | ICD-10-CM | POA: Insufficient documentation

## 2013-07-11 DIAGNOSIS — R351 Nocturia: Secondary | ICD-10-CM | POA: Insufficient documentation

## 2013-07-11 DIAGNOSIS — G609 Hereditary and idiopathic neuropathy, unspecified: Secondary | ICD-10-CM | POA: Insufficient documentation

## 2013-07-11 NOTE — Patient Instructions (Signed)
Continue current medications. 

## 2013-07-26 ENCOUNTER — Encounter: Payer: Self-pay | Admitting: Internal Medicine

## 2013-10-29 LAB — LIPID PANEL
Cholesterol: 142 mg/dL (ref 0–200)
HDL: 45 mg/dL (ref 35–70)
LDL CALC: 74 mg/dL
LDl/HDL Ratio: 3.2
Triglycerides: 116 mg/dL (ref 40–160)

## 2013-11-06 ENCOUNTER — Encounter: Payer: Self-pay | Admitting: Internal Medicine

## 2013-11-06 ENCOUNTER — Non-Acute Institutional Stay: Payer: Medicare Other | Admitting: Internal Medicine

## 2013-11-06 VITALS — BP 104/60 | HR 68 | Ht 67.0 in | Wt 166.0 lb

## 2013-11-06 DIAGNOSIS — I10 Essential (primary) hypertension: Secondary | ICD-10-CM

## 2013-11-06 DIAGNOSIS — J8410 Pulmonary fibrosis, unspecified: Secondary | ICD-10-CM

## 2013-11-06 DIAGNOSIS — M11869 Other specified crystal arthropathies, unspecified knee: Secondary | ICD-10-CM

## 2013-11-06 DIAGNOSIS — M109 Gout, unspecified: Secondary | ICD-10-CM

## 2013-11-06 DIAGNOSIS — R351 Nocturia: Secondary | ICD-10-CM

## 2013-11-06 DIAGNOSIS — M11269 Other chondrocalcinosis, unspecified knee: Secondary | ICD-10-CM

## 2013-11-06 DIAGNOSIS — N183 Chronic kidney disease, stage 3 unspecified: Secondary | ICD-10-CM

## 2013-11-06 DIAGNOSIS — E785 Hyperlipidemia, unspecified: Secondary | ICD-10-CM

## 2013-11-06 DIAGNOSIS — J3489 Other specified disorders of nose and nasal sinuses: Secondary | ICD-10-CM

## 2013-11-06 HISTORY — DX: Other specified disorders of nose and nasal sinuses: J34.89

## 2013-11-06 MED ORDER — FLUTICASONE PROPIONATE 50 MCG/ACT NA SUSP: NASAL | Status: DC

## 2013-11-06 NOTE — Progress Notes (Signed)
Patient ID: Luis Barker, male   DOB: January 23, 1926, 80 y.o.   MRN: [id number]    Location:  [facility]   Place of Service: Clinic (12)    Allergies  Allergen Reactions  . Sulfa Antibiotics     Chief Complaint  Patient presents with  . 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 No dysuria.  Rhinorrhea: clear and watery drainage. Worse at meals. Would like to try something to help this problem.    Medications: Patient's Medications  New Prescriptions   No medications on file  Previous Medications   ATORVASTATIN (LIPITOR) 40 MG TABLET    Take 40 mg by mouth daily. For cholesterol   MULTIPLE VITAMINS-MINERALS ([employer] SPECTRAVITE) TABS    Take 1 tablet by mouth. Daily for vitamin   PROBIOTIC PRODUCT (PROBIOTIC ACIDOPHILUS) CAPS    Take by mouth. Take one capsule daily  Modified Medications   No medications on file  Discontinued Medications   FAMOTIDINE (PEPCID) 10 MG TABLET    Take 10 mg by mouth. Take one daily as needed for acid control   PREDNISONE (DELTASONE) 10 MG TABLET    Take 10 mg by mouth daily. Take one daily as needed for gout   TEMAZEPAM (RESTORIL) 15 MG CAPSULE    Take one capsule by mouth nightly as needed for rest     Review of Systems  Constitutional: Negative for fever, chills, activity change, appetite change and fatigue.  HENT: Positive for rhinorrhea.   Eyes: Negative.   Respiratory: Positive for shortness of breath.   Cardiovascular: Positive for leg swelling. Negative for chest pain and palpitations.       History of coronary artery disease. Patient  had stents placed.  Gastrointestinal: Negative.   Endocrine: Negative.   Genitourinary: Negative.   Musculoskeletal: Positive for back pain.  Skin: Negative.   Allergic/Immunologic: Negative.   Neurological: Negative for tremors, syncope and weakness.       Altered sensation in feet. History of sensory and motor polyneuropathy to a mild degree documented on [facility] January 2014.  Hematological: Negative.   Psychiatric/Behavioral: Positive for sleep disturbance.    Filed Vitals:   11/06/13 0907  BP: 104/60  Pulse: 68  Height: 5\' 7"  (1.702 m)  Weight: 166 lb (75.297 kg)   Physical Exam  Constitutional: He is oriented to person, place, and time. He appears well-developed and well-nourished. No distress.  HENT:  Head: Normocephalic and atraumatic.  Right Ear: External ear normal.  Left Ear: External ear normal.  Nose: Nose normal.  Mouth/Throat: Oropharynx is clear and moist.  Eyes: Conjunctivae and EOM are normal. Pupils are equal, round, and reactive to light.  Neck: Neck supple. No JVD present. No tracheal deviation present.  Cardiovascular: Normal rate, regular rhythm, normal heart sounds and intact distal pulses.  Exam reveals no gallop and no friction rub.   No murmur heard. Pulmonary/Chest: No respiratory distress. He has no wheezes. He has rales (dry. Throughout both lungs.). He exhibits no tenderness.  Abdominal: He exhibits no distension and no mass. There is no tenderness. No hernia.  Genitourinary: Rectum normal, prostate normal and penis normal. Guaiac negative stool. No penile  tenderness.  Musculoskeletal: Normal range of motion. He exhibits no edema and no tenderness.  Lymphadenopathy:    He has no cervical adenopathy.  Neurological: He is alert and oriented to person, place, and time. He displays normal reflexes. No cranial nerve deficit. He exhibits normal muscle tone. Coordination normal.  05/08/13 MMSE 30/30. Passed clock drawing.  Skin: No rash noted. No erythema.  No pallor.  Psychiatric: He has a normal mood and affect. His behavior is normal. Judgment and thought content normal.     Labs reviewed: Nursing Home on 11/06/2013  Component Date Value Ref Range Status  . LDl/HDL Ratio 10/29/2013 3.2   Final  . Triglycerides 10/29/2013 116  40 - 160 mg/dL Final  . Cholesterol 10/29/2013 142  0 - 200 mg/dL Final  . HDL 10/29/2013 45  35 - 70 mg/dL Final  . LDL Cholesterol 10/29/2013 74   Final      Assessment/Plan  1. Unspecified essential hypertension controlled  2. Other and unspecified hyperlipidemia controlled  3. Gout controlled  4. Chronic kidney disease, stage III (moderate) Recheck lab  5. 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 Postinflammatory pulmonary fibrosis Chronic. DOE is not worse.  7. Nocturia unchanged  8. Rhinorrhea - fluticasone (FLONASE) 50 MCG/ACT nasal spray; 1-2 sprays in each nostril daily to help drainage.  Dispense: 16 g; Refill: 6

## 2013-11-21 ENCOUNTER — Encounter: Payer: Self-pay | Admitting: Internal Medicine

## 2013-12-18 ENCOUNTER — Encounter: Payer: Self-pay | Admitting: Internal Medicine

## 2014-03-26 ENCOUNTER — Non-Acute Institutional Stay: Payer: Medicare Other | Admitting: Internal Medicine

## 2014-03-26 VITALS — BP 132/72 | HR 68 | Wt 161.0 lb

## 2014-03-26 DIAGNOSIS — Z638 Other specified problems related to primary support group: Secondary | ICD-10-CM

## 2014-03-26 DIAGNOSIS — I2510 Atherosclerotic heart disease of native coronary artery without angina pectoris: Secondary | ICD-10-CM

## 2014-03-26 DIAGNOSIS — G4700 Insomnia, unspecified: Secondary | ICD-10-CM

## 2014-03-26 DIAGNOSIS — I10 Essential (primary) hypertension: Secondary | ICD-10-CM

## 2014-03-26 MED ORDER — TRAZODONE HCL 100 MG PO TABS: ORAL_TABLET | ORAL | Status: DC

## 2014-03-26 NOTE — Progress Notes (Signed)
Patient ID: Luis Barker, male   DOB: 1926-02-15, 80 y.o.   MRN: [id number]    Location:  [facility]   Place of Service: Clinic (12)    Allergies  Allergen Reactions  . Sulfa Antibiotics     Chief Complaint  Patient presents with  . Medical Management of Chronic Issues    stress-- because of wife, causing trouble sleeping "driving him crazy"  . Insomnia    falls asleep but wakes up durning the night because of wife or to use bathroom. Takes Temazepam at 2:00 am.     HPI:  Insomnia, unspecified: falls asleep but has mid night awakening because of his wife or because he must use the bathroom. Cannot get back to sleep. Taking old temazepam about 2 AM. [employer] wrote him a letter that told him he should get his doctor to write a prescription for another medication for rest.  Unspecified essential hypertension: controlled  Stress due to family tension: wife is chronically ill and currently having problems with IBS. Everything is beginning to get on his nerves,  Atherosclerosis of native coronary artery of native heart without angina pectoris: would like to quit going to so many doctors. Not having any cardiac symptoms. He is planning to stop going to Dr. Wynonia Lawman.    Medications: Patient's Medications  New Prescriptions   No medications on file  Previous Medications   ASPIRIN 81 MG TABLET    Take 81 mg by mouth daily.   ATORVASTATIN (LIPITOR) 40 MG TABLET    Take 40 mg by mouth daily. For cholesterol   MULTIPLE VITAMINS-MINERALS ([employer] SPECTRAVITE) TABS    Take 1 tablet by mouth. Daily for vitamin   TEMAZEPAM (RESTORIL) 15 MG CAPSULE    Take 15 mg by mouth. Takes one at 2:00am for sleep  Modified Medications   No medications on file  Discontinued Medications   FLUTICASONE (FLONASE) 50 MCG/ACT NASAL SPRAY    1-2 sprays in each nostril daily to help drainage.   PROBIOTIC PRODUCT (PROBIOTIC ACIDOPHILUS) CAPS    Take by mouth. Take one capsule daily      Review of Systems  Constitutional: Negative for fever, chills, activity change, appetite change and fatigue.  HENT: Positive for rhinorrhea.   Eyes: Negative.   Respiratory: Positive for shortness of breath.   Cardiovascular: Positive for leg swelling. Negative for chest pain and palpitations.       History of coronary artery disease. Patient had stents placed.  Gastrointestinal: Negative.   Endocrine: Negative.   Genitourinary: Negative.   Musculoskeletal: Positive for back pain.  Skin: Negative.   Allergic/Immunologic: Negative.   Neurological: Negative for tremors, syncope and weakness.       Altered sensation in feet. History of sensory and motor polyneuropathy to a mild degree documented on [facility] January 2014.  Hematological: Negative.   Psychiatric/Behavioral: Positive for sleep disturbance. The patient is nervous/anxious.     Filed Vitals:   03/26/14 1149  BP: 132/72  Pulse: 68  Weight: 161 lb (73.029 kg)   Body mass index is 25.21 kg/(m^2).  Physical Exam  Constitutional: He is oriented to person, place, and time. He appears well-developed and well-nourished. No distress.  HENT:  Head: Normocephalic and atraumatic.  Right Ear: External ear normal.  Left Ear: External ear normal.  Nose: Nose normal.  Mouth/Throat: Oropharynx is clear and moist.  Eyes: Conjunctivae and EOM are normal. Pupils are equal, round, and reactive to light.  Neck: Neck supple. No JVD  present. No tracheal deviation present.  Cardiovascular: Normal rate, regular rhythm, normal heart sounds and intact distal pulses.  Exam reveals no gallop and no friction rub.   No murmur heard. Pulmonary/Chest: No respiratory distress. He has no wheezes. He has rales (dry. Throughout both lungs.). He exhibits no tenderness.  Abdominal: He exhibits no distension and no mass. There is no tenderness. No hernia.  Genitourinary: Rectum normal, prostate normal and penis normal. Guaiac negative stool. No penile  tenderness.  Musculoskeletal: Normal range of motion. He exhibits no edema and no tenderness.  Slightly hunched forward when walking. Paucity of other movements. Suggestive of parkinsonoid walk.  Lymphadenopathy:    He has no cervical adenopathy.  Neurological: He is alert and oriented to person, place, and time. He displays normal reflexes. No cranial nerve deficit. He exhibits normal muscle tone. Coordination normal.  05/08/13 MMSE 30/30. Passed clock drawing.  Skin: No rash noted. No erythema. No pallor.  Psychiatric: He has a normal mood and affect. His behavior is normal. Judgment and thought content normal.     Labs reviewed: No visits with results within 3 Month(s) from this visit. Latest known visit with results is:  Nursing Home on 11/06/2013  Component Date Value Ref Range Status  . LDl/HDL Ratio 10/29/2013 3.2   Final  . Triglycerides 10/29/2013 116  40 - 160 mg/dL Final  . Cholesterol 10/29/2013 142  0 - 200 mg/dL Final  . HDL 10/29/2013 45  35 - 70 mg/dL Final  . LDL Cholesterol 10/29/2013 74   Final      Assessment/Plan  1. Insomnia, unspecified Stop temazepam - traZODone (DESYREL) 100 MG tablet; One nightly for rest and to help nerves  Dispense: 90 tablet; Refill: 3  2. Unspecified essential hypertension controlled  3. Stress due to family tension Started trazodone.   4. Atherosclerosis of native coronary artery of native heart without angina pectoris I am willing to follow cardiac issues and will call in cardiologist if there are problems

## 2014-04-02 ENCOUNTER — Encounter: Payer: Medicare Other | Admitting: Internal Medicine

## 2014-05-14 ENCOUNTER — Encounter: Payer: Self-pay | Admitting: Internal Medicine

## 2014-06-04 ENCOUNTER — Encounter: Payer: Self-pay | Admitting: Internal Medicine

## 2014-06-04 ENCOUNTER — Non-Acute Institutional Stay: Payer: Medicare Other | Admitting: Internal Medicine

## 2014-06-04 VITALS — BP 144/60 | HR 56 | Temp 97.5°F | Wt 162.0 lb

## 2014-06-04 DIAGNOSIS — I739 Peripheral vascular disease, unspecified: Secondary | ICD-10-CM

## 2014-06-04 DIAGNOSIS — M545 Low back pain, unspecified: Secondary | ICD-10-CM

## 2014-06-04 DIAGNOSIS — G609 Hereditary and idiopathic neuropathy, unspecified: Secondary | ICD-10-CM

## 2014-06-04 MED ORDER — GABAPENTIN 300 MG PO CAPS: ORAL_CAPSULE | ORAL | Status: DC

## 2014-06-04 NOTE — Progress Notes (Signed)
Patient ID: Luis Barker, male   DOB: 1926/07/07, 80 y.o.   MRN: [id number]    [facility]     Place of Service: Clinic (12)    Allergies  Allergen Reactions  . Sulfa Antibiotics     Chief Complaint  Patient presents with  . Foot Burn    bilateral tingling and burning only when laying down, no problem standing or sitting.    HPI:  Hereditary and idiopathic peripheral neuropathy: ibuprofen seems to help some. He cannot sleep due to the discomfort. Denies restless legs or cramps.  Peripheral vascular disease: he has strong DP and PT pulses on the right and palpable on the left  Midline low back pain without sciatica: getting worse. Wants to try LS support    Medications: Patient's Medications  New Prescriptions   No medications on file  Previous Medications   ASPIRIN 81 MG TABLET    Take 81 mg by mouth daily.   ATORVASTATIN (LIPITOR) 40 MG TABLET    Take 40 mg by mouth daily. For cholesterol   MULTIPLE VITAMINS-MINERALS ([employer] SPECTRAVITE) TABS    Take 1 tablet by mouth. Daily for vitamin   TRAZODONE (DESYREL) 100 MG TABLET    One nightly for rest and to help nerves  Modified Medications   No medications on file  Discontinued Medications   No medications on file     Review of Systems  Constitutional: Negative for fever, chills, activity change, appetite change and fatigue.  HENT: Positive for rhinorrhea.   Eyes: Negative.   Respiratory: Positive for shortness of breath.   Cardiovascular: Positive for leg swelling. Negative for chest pain and palpitations.       History of coronary artery disease. Patient had stents placed.  Gastrointestinal: Negative.   Endocrine: Negative.   Genitourinary: Negative.   Musculoskeletal: Positive for back pain.  Skin: Negative.   Allergic/Immunologic: Negative.   Neurological: Negative for tremors, syncope and weakness.       Altered sensation in feet. History of sensory and motor polyneuropathy to a mild degree  documented on [facility] January 2014.  Hematological: Negative.   Psychiatric/Behavioral: Positive for sleep disturbance. The patient is nervous/anxious.     Filed Vitals:   06/04/14 1155  BP: 144/60  Pulse: 56  Temp: 97.5 F (36.4 C)  TempSrc: Oral  Weight: 162 lb (73.483 kg)  SpO2: 94%   Body mass index is 25.37 kg/(m^2).  Physical Exam  Constitutional: He is oriented to person, place, and time. He appears well-developed and well-nourished. No distress.  HENT:  Head: Normocephalic and atraumatic.  Right Ear: External ear normal.  Left Ear: External ear normal.  Nose: Nose normal.  Mouth/Throat: Oropharynx is clear and moist.  Eyes: Conjunctivae and EOM are normal. Pupils are equal, round, and reactive to light.  Neck: Neck supple. No JVD present. No tracheal deviation present.  Cardiovascular: Normal rate, regular rhythm, normal heart sounds and intact distal pulses.  Exam reveals no gallop and no friction rub.   No murmur heard. Pulmonary/Chest: No respiratory distress. He has no wheezes. He has rales (dry. Throughout both lungs.). He exhibits no tenderness.  Abdominal: He exhibits no distension and no mass. There is no tenderness. No hernia.  Genitourinary: Rectum normal, prostate normal and penis normal. Guaiac negative stool. No penile tenderness.  Musculoskeletal: Normal range of motion. He exhibits no edema and no tenderness.  Slightly hunched forward when walking. Paucity of other movements. Suggestive of parkinsonoid walk.  Lymphadenopathy:  He has no cervical adenopathy.  Neurological: He is alert and oriented to person, place, and time. He displays normal reflexes. No cranial nerve deficit. He exhibits normal muscle tone. Coordination normal.  05/08/13 MMSE 30/30. Passed clock drawing.  Skin: No rash noted. No erythema. No pallor.  Psychiatric: He has a normal mood and affect. His behavior is normal. Judgment and thought content normal.     Labs reviewed: No visits  with results within 3 Month(s) from this visit. Latest known visit with results is:  Nursing Home on 11/06/2013  Component Date Value Ref Range Status  . LDl/HDL Ratio 10/29/2013 3.2   Final  . Triglycerides 10/29/2013 116  40 - 160 mg/dL Final  . Cholesterol 10/29/2013 142  0 - 200 mg/dL Final  . HDL 10/29/2013 45  35 - 70 mg/dL Final  . LDL Cholesterol 10/29/2013 74   Final     Assessment/Plan  1. Hereditary and idiopathic peripheral neuropathy -try gabapentin 300 mg hs  2. Peripheral vascular disease I think his circulation is normal  3. Midline low back pain without sciatica -LS support

## 2014-06-18 ENCOUNTER — Other Ambulatory Visit: Payer: Self-pay | Admitting: Internal Medicine

## 2014-07-02 HISTORY — PX: CATARACT EXTRACTION W/ INTRAOCULAR LENS  IMPLANT, BILATERAL: SHX1307

## 2014-07-09 ENCOUNTER — Encounter: Payer: Self-pay | Admitting: Internal Medicine

## 2014-08-05 LAB — BASIC METABOLIC PANEL
BUN: 24 mg/dL — AB (ref 4–21)
CREATININE: 1.6 mg/dL — AB (ref 0.6–1.3)
GLUCOSE: 84 mg/dL
POTASSIUM: 4.2 mmol/L (ref 3.4–5.3)
Sodium: 138 mmol/L (ref 137–147)

## 2014-08-05 LAB — LIPID PANEL
Cholesterol: 118 mg/dL (ref 0–200)
HDL: 43 mg/dL (ref 35–70)
LDL Cholesterol: 58 mg/dL
LDl/HDL Ratio: 2.7
Triglycerides: 83 mg/dL (ref 40–160)

## 2014-08-05 LAB — HEPATIC FUNCTION PANEL
ALK PHOS: 72 U/L (ref 25–125)
ALT: 14 U/L (ref 10–40)
AST: 22 U/L (ref 14–40)
BILIRUBIN, TOTAL: 0.6 mg/dL

## 2014-08-13 ENCOUNTER — Non-Acute Institutional Stay: Payer: Medicare Other | Admitting: Internal Medicine

## 2014-08-13 ENCOUNTER — Encounter: Payer: Self-pay | Admitting: Internal Medicine

## 2014-08-13 VITALS — BP 112/58 | HR 64 | Temp 97.8°F | Ht 67.0 in | Wt 165.0 lb

## 2014-08-13 DIAGNOSIS — N183 Chronic kidney disease, stage 3 unspecified: Secondary | ICD-10-CM

## 2014-08-13 DIAGNOSIS — M545 Low back pain, unspecified: Secondary | ICD-10-CM

## 2014-08-13 DIAGNOSIS — R351 Nocturia: Secondary | ICD-10-CM

## 2014-08-13 DIAGNOSIS — Z9181 History of falling: Secondary | ICD-10-CM

## 2014-08-13 DIAGNOSIS — J8410 Pulmonary fibrosis, unspecified: Secondary | ICD-10-CM

## 2014-08-13 DIAGNOSIS — I10 Essential (primary) hypertension: Secondary | ICD-10-CM

## 2014-08-13 DIAGNOSIS — I2510 Atherosclerotic heart disease of native coronary artery without angina pectoris: Secondary | ICD-10-CM

## 2014-08-13 DIAGNOSIS — G609 Hereditary and idiopathic neuropathy, unspecified: Secondary | ICD-10-CM

## 2014-08-13 DIAGNOSIS — E785 Hyperlipidemia, unspecified: Secondary | ICD-10-CM

## 2014-08-13 DIAGNOSIS — R269 Unspecified abnormalities of gait and mobility: Secondary | ICD-10-CM

## 2014-08-13 DIAGNOSIS — J3489 Other specified disorders of nose and nasal sinuses: Secondary | ICD-10-CM

## 2014-08-13 NOTE — Progress Notes (Signed)
Patient ID: Luis Barker, male   DOB: 08/16/1926, 80 y.o.   MRN: [id number]      HISTORY AND PHYSICAL  Location:  [place] of Service: Clinic (12)   Extended Emergency Contact Information Primary Emergency Contact: [facility] Address: [address]          [place], [place] [address] [place] Phone: [phone] Relation: Spouse  Advanced Directive information Does patient have an advance directive?: Yes, Type of Advance Directive: Healthcare Power of [place];Living will  Chief Complaint  Patient presents with  . Annual Exam    Comprehensive exam: blood pressure, cholesterol, thyroid, CAD  . Peripheral Neuropathy    worse, wakes him up at night, no problem durning the day. Patient states he doesn't have Neurontin, never has.     HPI:   Abnormality of gait: Short stepped. Abnormal center of balance. Not accompanied by tremor. Some increase in rigidity of legs and hips and knees.  History of fall: Simple fall in the bathtub room. He wound up in the tub. Emergency medical services had to be called to help get him out of the tub.  Rhinorrhea: Denies significant congestion. Clear rhinorrhea. Tried Flonase, but this did not help.  Chronic kidney disease, stage III (moderate): Unchanged. BUN 24 and creatinine 1.6.  Atherosclerosis of native coronary artery of native heart without angina pectoris: Stable  Hereditary and idiopathic peripheral neuropathy: Having more discomfort. He did not get the Neurontin as recommended 06/04/14. He has been using Advil once or twice most nights to help and finds this is satisfactory.  Midline low back pain without sciatica: Stable  Nocturia: Up to 3 times. No dysuria.  Hyperlipidemia: Controlled  Postinflammatory pulmonary fibrosis: Dyspnea with exertion. Unchanged.  Essential hypertension: Controlled    Past Medical History  Diagnosis Date  . Peripheral vascular disease,  unspecified 08/01/2012  . Insomnia, unspecified 08/01/2012  . Disturbance of skin sensation 08/01/2012  . Occlusion and stenosis of carotid artery 05/16/2012  . Unspecified disorder of thyroid 03/28/2012  . Gout, unspecified 03/28/2012  . Other disorder of calcium metabolism 03/28/2012  . Pain in limb 03/28/2012  . Other and unspecified hyperlipidemia 03/27/2012  . Anemia, unspecified 03/27/2012  . Unspecified essential hypertension 03/27/2012  . Postinflammatory pulmonary fibrosis 03/27/2012  . Chronic kidney disease, stage III (moderate) 03/27/2012  . Orthostatic hypotension 10/27/2009  . Dizziness and giddiness 10/23/2009  . Other and unspecified angina pectoris 05/14/2009  . Coronary atherosclerosis of native coronary artery 03/28/1993  . Calculus of kidney 03/29/1979  . Hip joint replacement by other means 03/29/1979  . Precordial pain 03/27/2012  . Bursitis, prepatellar 02/04/2012  . Carotid artery stenosis   . GERD (gastroesophageal reflux disease)   . H/O syncope   . Nephrolithiasis   . Congenital nasal septum deviation   . Rhinorrhea 11/06/2013    Past Surgical History  Procedure Laterality Date  . Total hip arthroplasty Left 1980     Dr. Lorin Mercy  . Coronary angioplasty with stent placement  05/16/2009    Dr Wynonia Lawman  . Coronary artery bypass graft  1994  . Cataract extraction w/ intraocular lens  implant, bilateral Bilateral 07/02/2014    Dr. Lucita Ferrara    Patient Care Team: Estill Dooms, MD as PCP - General (Internal Medicine) [facility] Mast X, NP as Nurse Practitioner (Nurse Practitioner) Jacolyn Reedy, MD as Consulting Physician (Cardiology) Johna Sheriff, MD as Consulting Physician (Ophthalmology) Ollen Gross  Janyce Llanos, MD as Consulting Physician (Nephrology) Marybelle Killings, MD as Consulting Physician (Orthopedic Surgery) Vevelyn Royals, MD as Consulting Physician (Ophthalmology)  History   Social History  . Marital Status: Married    Spouse Name: N/A    Number of  Children: N/A  . Years of Education: N/A   Occupational History  . retired [employer] photographer    Social History Main Topics  . Smoking status: Former Smoker    Quit date: 12/27/1982  . Smokeless tobacco: Never Used  . Alcohol Use: No  . Drug Use: No  . Sexual Activity: No   Other Topics Concern  . Not on file   Social History Narrative   Patient [facility]   Married   Has a living will, [place]   Stopped smoking 1984   Alcohol none   Exercise none           reports that he quit smoking about 31 years ago. He has never used smokeless tobacco. He reports that he does not drink alcohol or use illicit drugs.  Family History  Problem Relation Age of Onset  . Stroke Mother   . Heart disease Father   . Dementia Brother    Family Status  Relation Status Death Age  . Mother Deceased 60    CVA  . Father Deceased 17    MI  . Brother Deceased 56    Dementia  . Daughter Alive   . Daughter Alive   . Daughter Alive     Immunization History  Administered Date(s) Administered  . Influenza Whole 05/23/2012, 05/24/2013  . Influenza-Unspecified 06/06/2014  . Pneumococcal Polysaccharide-23 09/09/2005  . Td 02/06/2007  . Zoster 09/08/2012    Allergies  Allergen Reactions  . Sulfa Antibiotics     Medications: Patient's Medications  New Prescriptions   No medications on file  Previous Medications   ASPIRIN 81 MG TABLET    Take 81 mg by mouth daily.   ATORVASTATIN (LIPITOR) 40 MG TABLET    Take 40 mg by mouth daily. For cholesterol   MULTIPLE VITAMINS-MINERALS ([employer] SPECTRAVITE) TABS    Take 1 tablet by mouth. Daily for vitamin   TRAZODONE (DESYREL) 100 MG TABLET    TAKE 1 TABLET BY MOUTH AT BEDTIME FOR REST  Modified Medications   No medications on file  Discontinued Medications   GABAPENTIN (NEURONTIN) 300 MG CAPSULE    One at bed to help control neuropathic pain    Review of Systems  Constitutional: Negative for fever, chills, activity change,  appetite change and fatigue.  HENT: Positive for rhinorrhea.   Eyes: Negative.   Respiratory: Positive for shortness of breath.   Cardiovascular: Positive for leg swelling. Negative for chest pain and palpitations.       History of coronary artery disease. Patient had stents placed.  Gastrointestinal: Negative.   Endocrine: Negative.   Genitourinary: Negative.   Musculoskeletal: Positive for back pain.  Skin: Negative.   Allergic/Immunologic: Negative.   Neurological: Negative for tremors, syncope and weakness.       Altered sensation in feet. History of sensory and motor polyneuropathy to a mild degree documented on [facility] January 2014.  Hematological: Negative.   Psychiatric/Behavioral: Positive for sleep disturbance. The patient is nervous/anxious.     Filed Vitals:   08/13/14 1033  BP: 112/58  Pulse: 64  Temp: 97.8 F (36.6 C)  TempSrc: Oral  Height: 5\' 7"  (1.702 m)  Weight: 165 lb (74.844 kg)  SpO2: 98%   Body mass index is 25.84 kg/(m^2).  Physical Exam  Constitutional: He is oriented to person, place, and time. He appears well-developed and well-nourished. No distress.  HENT:  Head: Normocephalic and atraumatic.  Right Ear: External ear normal.  Left Ear: External ear normal.  Nose: Nose normal.  Mouth/Throat: Oropharynx is clear and moist.  Eyes: Conjunctivae and EOM are normal. Pupils are equal, round, and reactive to light.  Neck: Neck supple. No JVD present. No tracheal deviation present.  Cardiovascular: Normal rate, regular rhythm, normal heart sounds and intact distal pulses.  Exam reveals no gallop and no friction rub.   No murmur heard. Pulmonary/Chest: No respiratory distress. He has no wheezes. He has rales (dry. Throughout both lungs.). He exhibits no tenderness.  Abdominal: He exhibits no distension and no mass. There is no tenderness. No hernia.  Genitourinary: Rectum normal, prostate normal and penis normal. Guaiac negative stool. No penile tenderness.   Musculoskeletal: Normal range of motion. He exhibits no edema or tenderness.  Slightly hunched forward when walking. Paucity of other movements. Suggestive of parkinsonoid walk.  Lymphadenopathy:    He has no cervical adenopathy.  Neurological: He is alert and oriented to person, place, and time. He displays normal reflexes. No cranial nerve deficit. He exhibits normal muscle tone. Coordination normal.  05/08/13 MMSE 30/30. Passed clock drawing.  Skin: No rash noted. No erythema. No pallor.  Psychiatric: He has a normal mood and affect. His behavior is normal. Judgment and thought content normal.     Labs reviewed: Nursing Home on 08/13/2014  Component Date Value Ref Range Status  . Glucose 08/05/2014 84   Final  . BUN 08/05/2014 24* 4 - 21 mg/dL Final  . Creatinine 08/05/2014 1.6* 0.6 - 1.3 mg/dL Final  . Potassium 08/05/2014 4.2  3.4 - 5.3 mmol/L Final  . Sodium 08/05/2014 138  137 - 147 mmol/L Final  . LDl/HDL Ratio 08/05/2014 2.7   Final  . Triglycerides 08/05/2014 83  40 - 160 mg/dL Final  . Cholesterol 08/05/2014 118  0 - 200 mg/dL Final  . HDL 08/05/2014 43  35 - 70 mg/dL Final  . LDL Cholesterol 08/05/2014 58   Final  . Alkaline Phosphatase 08/05/2014 72  25 - 125 U/L Final  . ALT 08/05/2014 14  10 - 40 U/L Final  . AST 08/05/2014 22  14 - 40 U/L Final  . Bilirubin, Total 08/05/2014 0.6   Final     Assessment/Plan  1. Abnormality of gait Odd gait pattern that is clearly not normal. He has difficulty with short steps and both feet are turned outwards. There is some rigidity of the legs at hips and knees, but this is not present in the arms or shoulders. No focal muscular tenderness. There is some back discomfort and he has a history of lumbago.  2. History of fall Simple fall. Because of gait disturbance he is at risk for additional falls.  3. Rhinorrhea Chronic and unresponsive to steroids  4. Chronic kidney disease, stage III (moderate) Stable -BMP, future   5.  Atherosclerosis of native coronary artery of native heart without angina pectoris Stable  6. Hereditary and idiopathic peripheral neuropathy Getting worse. He says that he can do with the ibuprofen he is currently using. I told him if he called the office we would call in a prescription for gabapentin 300 mg at bedtime in the future  7. Midline low back pain without sciatica Stable  8. Nocturia Unchanged  9. Hyperlipidemia Controlled   10. Postinflammatory pulmonary fibrosis Unchanged  11. Essential hypertension Controlled

## 2014-08-20 ENCOUNTER — Encounter: Payer: Self-pay | Admitting: Internal Medicine

## 2014-09-18 ENCOUNTER — Other Ambulatory Visit: Payer: Self-pay | Admitting: Internal Medicine

## 2014-12-12 ENCOUNTER — Other Ambulatory Visit: Payer: Self-pay | Admitting: Internal Medicine

## 2015-01-08 ENCOUNTER — Other Ambulatory Visit: Payer: Self-pay | Admitting: Internal Medicine

## 2015-01-17 ENCOUNTER — Other Ambulatory Visit: Payer: Self-pay | Admitting: Internal Medicine

## 2015-02-10 ENCOUNTER — Encounter: Payer: Self-pay | Admitting: *Deleted

## 2015-02-10 LAB — BASIC METABOLIC PANEL
BUN: 32 mg/dL — AB (ref 4–21)
CREATININE: 1.9 mg/dL — AB (ref 0.6–1.3)
Glucose: 95 mg/dL
POTASSIUM: 4.2 mmol/L (ref 3.4–5.3)
SODIUM: 143 mmol/L (ref 137–147)

## 2015-02-18 ENCOUNTER — Non-Acute Institutional Stay: Payer: PPO | Admitting: Internal Medicine

## 2015-02-18 ENCOUNTER — Encounter: Payer: Self-pay | Admitting: Internal Medicine

## 2015-02-18 VITALS — BP 142/72 | HR 60 | Temp 98.2°F | Wt 150.0 lb

## 2015-02-18 DIAGNOSIS — I10 Essential (primary) hypertension: Secondary | ICD-10-CM | POA: Diagnosis not present

## 2015-02-18 DIAGNOSIS — R269 Unspecified abnormalities of gait and mobility: Secondary | ICD-10-CM

## 2015-02-18 DIAGNOSIS — C411 Malignant neoplasm of mandible: Secondary | ICD-10-CM | POA: Diagnosis not present

## 2015-02-18 DIAGNOSIS — Z9181 History of falling: Secondary | ICD-10-CM

## 2015-02-18 DIAGNOSIS — N183 Chronic kidney disease, stage 3 unspecified: Secondary | ICD-10-CM

## 2015-02-18 NOTE — Progress Notes (Signed)
Patient ID: Luis Barker, male   DOB: 03-12-1926, 81 y.o.   MRN: [id number]    [facility]     Place of Service: Clinic (12)     Allergies  Allergen Reactions  . Sulfa Antibiotics     Chief Complaint  Patient presents with  . Medical Management of Chronic Issues    blood pressure, CKD, CAD  . Jaw Pain    02/12/15 new Dx: of cancer of right jaw -Dr. Conley Canal at [place] [facility] after having tooth removed, it didn't heal and having pain. To have CT scan next week.    HPI:  Not feeling well. Jaw did not heal after a tooth extraction a couple months ago. Had a cancer in the jaw confirmed by biopsy. To get CT scan at [facility]. Head and neck surgeon, Dr. Conley Canal, in [place], will see him afterwards. Jaw is sore, but not so bad that he can't eat. He is losing strength and weight as a result of compromised intake.  Medications: Patient's Medications  New Prescriptions   No medications on file  Previous Medications   ASPIRIN 81 MG TABLET    Take 81 mg by mouth daily.   ATORVASTATIN (LIPITOR) 40 MG TABLET    Take 40 mg by mouth daily. For cholesterol   ATORVASTATIN (LIPITOR) 80 MG TABLET    TAKE 1/2 A TABLET BY MOUTH DAILY TO CONTROL CHOLESTEROL   CHLORHEXIDINE (PERIDEX) 0.12 % SOLUTION    Swish and spit 1/2 oz for 30 seconds twice a day   IBUPROFEN (ADVIL,MOTRIN) 200 MG TABLET    Take 200 mg by mouth. Take one four times a day for pain   MULTIPLE VITAMINS-MINERALS ([employer] SPECTRAVITE) TABS    Take 1 tablet by mouth. Daily for vitamin   TRAZODONE (DESYREL) 100 MG TABLET    TAKE 1 TABLET AT BEDTIME FOR REST  Modified Medications   No medications on file  Discontinued Medications   No medications on file     Review of Systems  Constitutional: Negative for fever, chills, activity change, appetite change and fatigue.  HENT: Positive for rhinorrhea.   Eyes: Negative.   Respiratory: Positive for shortness of breath.   Cardiovascular: Positive for leg  swelling. Negative for chest pain and palpitations.       History of coronary artery disease. Patient had stents placed.  Gastrointestinal: Negative.   Endocrine: Negative.   Genitourinary: Negative.   Musculoskeletal: Positive for back pain.  Skin: Negative.   Allergic/Immunologic: Negative.   Neurological: Negative for tremors, syncope and weakness.       Altered sensation in feet. History of sensory and motor polyneuropathy to a mild degree documented on [facility] January 2014.  Hematological: Negative.   Psychiatric/Behavioral: Positive for sleep disturbance. The patient is nervous/anxious.     Filed Vitals:   02/18/15 1105  BP: 142/72  Pulse: 60  Temp: 98.2 F (36.8 C)  TempSrc: Oral  Weight: 150 lb (68.04 kg)  SpO2: 98%   Body mass index is 23.49 kg/(m^2).  Physical Exam  Constitutional: He is oriented to person, place, and time. He appears well-developed and well-nourished. No distress.  HENT:  Head: Normocephalic and atraumatic.  Right Ear: External ear normal.  Left Ear: External ear normal.  Nose: Nose normal.  Mouth/Throat: Oropharynx is clear and moist.  Eyes: Conjunctivae and EOM are normal. Pupils are equal, round, and reactive to light.  Neck: Neck supple. No JVD present. No tracheal deviation present.  Cardiovascular: Normal  rate, regular rhythm, normal heart sounds and intact distal pulses.  Exam reveals no gallop and no friction rub.   No murmur heard. Pulmonary/Chest: No respiratory distress. He has no wheezes. He has rales (dry. Throughout both lungs.). He exhibits no tenderness.  Abdominal: He exhibits no distension and no mass. There is no tenderness. No hernia.  Genitourinary: Rectum normal, prostate normal and penis normal. Guaiac negative stool. No penile tenderness.  Musculoskeletal: Normal range of motion. He exhibits no edema or tenderness.  Slightly hunched forward when walking. Paucity of other movements. Suggestive of parkinsonoid walk.    Lymphadenopathy:    He has no cervical adenopathy.  Neurological: He is alert and oriented to person, place, and time. He displays normal reflexes. No cranial nerve deficit. He exhibits normal muscle tone. Coordination normal.  05/08/13 MMSE 30/30. Passed clock drawing.  Skin: No rash noted. No erythema. No pallor.  Psychiatric: He has a normal mood and affect. His behavior is normal. Judgment and thought content normal.     Labs reviewed: Abstract on 02/10/2015  Component Date Value Ref Range Status  . Glucose 02/10/2015 95   Final  . BUN 02/10/2015 32* 4 - 21 mg/dL Final  . Creatinine 02/10/2015 1.9* 0.6 - 1.3 mg/dL Final  . Potassium 02/10/2015 4.2  3.4 - 5.3 mmol/L Final  . Sodium 02/10/2015 143  137 - 147 mmol/L Final     Assessment/Plan 1. Essential hypertension Minimal elevation in systolic blood pressure at 142. Not currently treated with anti-hypertensive medication. Patient has lost 15 pounds of weight since December 2015. This is related to his recent issues of cancer of the lower jaw described above. There is high potential for him to lose further weight. I have chosen not to add any additional anti-hypertensive medication.  2. Chronic kidney disease, stage III (moderate) Slight deterioration in renal function since last checked. This is a long-standing problem.  3. Abnormality of gait Selinda Eon steady on his feet. Shuffling gait.  4. Cancer of lower jaw bone Appointment set up as noted above.  I requested patient call for follow-up appointment once a course of therapy has been determined for his cancer of the jaw bone. He needs further follow-up for both weight loss as well as blood pressure.  5. History of fall Despite the unsteady gait, he has had no further falls.

## 2015-03-09 ENCOUNTER — Emergency Department (HOSPITAL_COMMUNITY)
Admission: EM | Admit: 2015-03-09 | Discharge: 2015-03-09 | Disposition: A | Discharge status: Home / Self Care | Payer: PPO | Attending: Emergency Medicine | Admitting: Emergency Medicine

## 2015-03-09 ENCOUNTER — Encounter (HOSPITAL_COMMUNITY): Payer: Self-pay

## 2015-03-09 ENCOUNTER — Emergency Department (HOSPITAL_COMMUNITY): Payer: PPO

## 2015-03-09 DIAGNOSIS — E785 Hyperlipidemia, unspecified: Secondary | ICD-10-CM | POA: Insufficient documentation

## 2015-03-09 DIAGNOSIS — Y92000 Kitchen of unspecified non-institutional (private) residence as  the place of occurrence of the external cause: Secondary | ICD-10-CM | POA: Diagnosis not present

## 2015-03-09 DIAGNOSIS — S3992XA Unspecified injury of lower back, initial encounter: Secondary | ICD-10-CM | POA: Insufficient documentation

## 2015-03-09 DIAGNOSIS — Q674 Other congenital deformities of skull, face and jaw: Secondary | ICD-10-CM | POA: Diagnosis not present

## 2015-03-09 DIAGNOSIS — Z9660 Presence of unspecified orthopedic joint implant: Secondary | ICD-10-CM | POA: Insufficient documentation

## 2015-03-09 DIAGNOSIS — Z8709 Personal history of other diseases of the respiratory system: Secondary | ICD-10-CM | POA: Diagnosis not present

## 2015-03-09 DIAGNOSIS — Z8739 Personal history of other diseases of the musculoskeletal system and connective tissue: Secondary | ICD-10-CM | POA: Diagnosis not present

## 2015-03-09 DIAGNOSIS — Z87442 Personal history of urinary calculi: Secondary | ICD-10-CM | POA: Diagnosis not present

## 2015-03-09 DIAGNOSIS — M549 Dorsalgia, unspecified: Secondary | ICD-10-CM

## 2015-03-09 DIAGNOSIS — Z85818 Personal history of malignant neoplasm of other sites of lip, oral cavity, and pharynx: Secondary | ICD-10-CM | POA: Diagnosis not present

## 2015-03-09 DIAGNOSIS — Z9861 Coronary angioplasty status: Secondary | ICD-10-CM | POA: Diagnosis not present

## 2015-03-09 DIAGNOSIS — M545 Low back pain, unspecified: Secondary | ICD-10-CM

## 2015-03-09 DIAGNOSIS — Z862 Personal history of diseases of the blood and blood-forming organs and certain disorders involving the immune mechanism: Secondary | ICD-10-CM | POA: Diagnosis not present

## 2015-03-09 DIAGNOSIS — Z79899 Other long term (current) drug therapy: Secondary | ICD-10-CM | POA: Insufficient documentation

## 2015-03-09 DIAGNOSIS — Y9389 Activity, other specified: Secondary | ICD-10-CM | POA: Diagnosis not present

## 2015-03-09 DIAGNOSIS — I129 Hypertensive chronic kidney disease with stage 1 through stage 4 chronic kidney disease, or unspecified chronic kidney disease: Secondary | ICD-10-CM | POA: Insufficient documentation

## 2015-03-09 DIAGNOSIS — Y998 Other external cause status: Secondary | ICD-10-CM | POA: Diagnosis not present

## 2015-03-09 DIAGNOSIS — W19XXXA Unspecified fall, initial encounter: Secondary | ICD-10-CM

## 2015-03-09 DIAGNOSIS — N183 Chronic kidney disease, stage 3 (moderate): Secondary | ICD-10-CM | POA: Diagnosis not present

## 2015-03-09 DIAGNOSIS — W1839XA Other fall on same level, initial encounter: Secondary | ICD-10-CM | POA: Diagnosis not present

## 2015-03-09 DIAGNOSIS — G4700 Insomnia, unspecified: Secondary | ICD-10-CM | POA: Diagnosis not present

## 2015-03-09 DIAGNOSIS — Z8719 Personal history of other diseases of the digestive system: Secondary | ICD-10-CM | POA: Diagnosis not present

## 2015-03-09 DIAGNOSIS — Z7982 Long term (current) use of aspirin: Secondary | ICD-10-CM | POA: Diagnosis not present

## 2015-03-09 DIAGNOSIS — I25119 Atherosclerotic heart disease of native coronary artery with unspecified angina pectoris: Secondary | ICD-10-CM | POA: Diagnosis not present

## 2015-03-09 HISTORY — DX: Malignant (primary) neoplasm, unspecified: C80.1

## 2015-03-09 LAB — CBC
HCT: 34.3 % — ABNORMAL LOW (ref 39.0–52.0)
Hemoglobin: 11.3 g/dL — ABNORMAL LOW (ref 13.0–17.0)
MCH: 31.1 pg (ref 26.0–34.0)
MCHC: 32.9 g/dL (ref 30.0–36.0)
MCV: 94.5 fL (ref 78.0–100.0)
Platelets: 218 10*3/uL (ref 150–400)
RBC: 3.63 MIL/uL — ABNORMAL LOW (ref 4.22–5.81)
RDW: 13.4 % (ref 11.5–15.5)
WBC: 14.4 10*3/uL — ABNORMAL HIGH (ref 4.0–10.5)

## 2015-03-09 LAB — URINALYSIS, ROUTINE W REFLEX MICROSCOPIC
Bilirubin Urine: NEGATIVE
Glucose, UA: NEGATIVE mg/dL
Ketones, ur: NEGATIVE mg/dL
Leukocytes, UA: NEGATIVE
Nitrite: NEGATIVE
Protein, ur: NEGATIVE mg/dL
Specific Gravity, Urine: 1.013 (ref 1.005–1.030)
Urobilinogen, UA: 0.2 mg/dL (ref 0.0–1.0)
pH: 5.5 (ref 5.0–8.0)

## 2015-03-09 LAB — BASIC METABOLIC PANEL
Anion gap: 8 (ref 5–15)
BUN: 33 mg/dL — ABNORMAL HIGH (ref 6–20)
CO2: 25 mmol/L (ref 22–32)
Calcium: 9.4 mg/dL (ref 8.9–10.3)
Chloride: 108 mmol/L (ref 101–111)
Creatinine, Ser: 1.93 mg/dL — ABNORMAL HIGH (ref 0.61–1.24)
GFR calc Af Amer: 34 mL/min — ABNORMAL LOW (ref >60)
GFR calc non Af Amer: 30 mL/min — ABNORMAL LOW (ref >60)
Glucose, Bld: 96 mg/dL (ref 65–99)
Potassium: 3.7 mmol/L (ref 3.5–5.1)
Sodium: 141 mmol/L (ref 135–145)

## 2015-03-09 LAB — I-STAT TROPONIN, ED: Troponin i, poc: 0.04 ng/mL (ref 0.00–0.08)

## 2015-03-09 LAB — URINE MICROSCOPIC-ADD ON

## 2015-03-09 LAB — CBG MONITORING, ED: Glucose-Capillary: 85 mg/dL (ref 65–99)

## 2015-03-09 MED ORDER — TRAMADOL HCL 50 MG PO TABS: 50.0000 mg | ORAL_TABLET | Freq: Two times a day (BID) | ORAL | Status: AC | PRN

## 2015-03-09 MED ORDER — TRAMADOL HCL 50 MG PO TABS
50.0000 mg | ORAL_TABLET | Freq: Once | ORAL | Status: AC
  Administered 2015-03-09: 50 mg via ORAL
  Filled 2015-03-09: qty 1

## 2015-03-09 MED ORDER — ACETAMINOPHEN 500 MG PO TABS
500.0000 mg | ORAL_TABLET | Freq: Once | ORAL | Status: AC
Start: 2015-03-09 — End: 2015-03-09
  Administered 2015-03-09: 500 mg via ORAL
  Filled 2015-03-09: qty 1

## 2015-03-09 MED ORDER — SODIUM CHLORIDE 0.9 % IV BOLUS (SEPSIS)
500.0000 mL | Freq: Once | INTRAVENOUS | Status: AC
Start: 2015-03-09 — End: 2015-03-09
  Administered 2015-03-09: 500 mL via INTRAVENOUS

## 2015-03-09 NOTE — ED Notes (Signed)
Pt request to use restroom, info pt I would have to give him a urinal to use until he see his nurse or doc he could not get up to use restroom.Pt state he would just hold it and wait for his nurse or doc

## 2015-03-09 NOTE — ED Notes (Signed)
Patient was given urinal

## 2015-03-09 NOTE — ED Notes (Addendum)
Pt info me he needed to make a bm. Took pt to restroom info pt of urine sample we will need. Pt state he do not want to give sample while making a bm will after he is done. Once pt finish using restroom he info me he already went. State he do not see the point of a urine sample. Nurse have been info

## 2015-03-09 NOTE — Discharge Instructions (Signed)
Fall Prevention and Home Safety Falls cause injuries and can affect all age groups. It is possible to use preventive measures to significantly decrease the likelihood of falls. There are many simple measures which can make your home safer and prevent falls. OUTDOORS  Repair cracks and edges of walkways and driveways.  Remove high doorway thresholds.  Trim shrubbery on the main path into your home.  Have good outside lighting.  Clear walkways of tools, rocks, debris, and clutter.  Check that handrails are not broken and are securely fastened. Both sides of steps should have handrails.  Have leaves, snow, and ice cleared regularly.  Use sand or salt on walkways during winter months.  In the garage, clean up grease or oil spills. BATHROOM  Install night lights.  Install grab bars by the toilet and in the tub and shower.  Use non-skid mats or decals in the tub or shower.  Place a plastic non-slip stool in the shower to sit on, if needed.  Keep floors dry and clean up all water on the floor immediately.  Remove soap buildup in the tub or shower on a regular basis.  Secure bath mats with non-slip, double-sided rug tape.  Remove throw rugs and tripping hazards from the floors. BEDROOMS  Install night lights.  Make sure a bedside light is easy to reach.  Do not use oversized bedding.  Keep a telephone by your bedside.  Have a firm chair with side arms to use for getting dressed.  Remove throw rugs and tripping hazards from the floor. KITCHEN  Keep handles on pots and pans turned toward the center of the stove. Use back burners when possible.  Clean up spills quickly and allow time for drying.  Avoid walking on wet floors.  Avoid hot utensils and knives.  Position shelves so they are not too high or low.  Place commonly used objects within easy reach.  If necessary, use a sturdy step stool with a grab bar when reaching.  Keep electrical cables out of the  way.  Do not use floor polish or wax that makes floors slippery. If you must use wax, use non-skid floor wax.  Remove throw rugs and tripping hazards from the floor. STAIRWAYS  Never leave objects on stairs.  Place handrails on both sides of stairways and use them. Fix any loose handrails. Make sure handrails on both sides of the stairways are as long as the stairs.  Check carpeting to make sure it is firmly attached along stairs. Make repairs to worn or loose carpet promptly.  Avoid placing throw rugs at the top or bottom of stairways, or properly secure the rug with carpet tape to prevent slippage. Get rid of throw rugs, if possible.  Have an electrician put in a light switch at the top and bottom of the stairs. OTHER FALL PREVENTION TIPS  Wear low-heel or rubber-soled shoes that are supportive and fit well. Wear closed toe shoes.  When using a stepladder, make sure it is fully opened and both spreaders are firmly locked. Do not climb a closed stepladder.  Add color or contrast paint or tape to grab bars and handrails in your home. Place contrasting color strips on first and last steps.  Learn and use mobility aids as needed. Install an electrical emergency response system.  Turn on lights to avoid dark areas. Replace light bulbs that burn out immediately. Get light switches that glow.  Arrange furniture to create clear pathways. Keep furniture in the same place.  Firmly attach carpet with non-skid or double-sided tape.  Eliminate uneven floor surfaces.  Select a carpet pattern that does not visually hide the edge of steps.  Be aware of all pets. OTHER HOME SAFETY TIPS  Set the water temperature for 120 F (48.8 C).  Keep emergency numbers on or near the telephone.  Keep smoke detectors on every level of the home and near sleeping areas. Document Released: 07/30/2002 Document Revised: 02/08/2012 Document Reviewed: 10/29/2011 [facility] Patient Information 2015  [place], [place]. This information is not intended to replace advice given to you by your health care provider. Make sure you discuss any questions you have with your health care provider. Back Pain, Adult Low back pain is very common. About 1 in 5 people have back pain.The cause of low back pain is rarely dangerous. The pain often gets better over time.About half of people with a sudden onset of back pain feel better in just 2 weeks. About 8 in 10 people feel better by 6 weeks.  CAUSES Some common causes of back pain include:  Strain of the muscles or ligaments supporting the spine.  Wear and tear (degeneration) of the spinal discs.  Arthritis.  Direct injury to the back. DIAGNOSIS Most of the time, the direct cause of low back pain is not known.However, back pain can be treated effectively even when the exact cause of the pain is unknown.Answering your caregiver's questions about your overall health and symptoms is one of the most accurate ways to make sure the cause of your pain is not dangerous. If your caregiver needs more information, he or she may order lab work or imaging tests (X-rays or MRIs).However, even if imaging tests show changes in your back, this usually does not require surgery. HOME CARE INSTRUCTIONS For many people, back pain returns.Since low back pain is rarely dangerous, it is often a condition that people can learn to [facility] their own.   Remain active. It is stressful on the back to sit or stand in one place. Do not sit, drive, or stand in one place for more than 30 minutes at a time. Take short walks on level surfaces as soon as pain allows.Try to increase the length of time you walk each day.  Do not stay in bed.Resting more than 1 or 2 days can delay your recovery.  Do not avoid exercise or work.Your body is made to move.It is not dangerous to be active, even though your back may hurt.Your back will likely heal faster if you return to being active before  your pain is gone.  Pay attention to your body when you bend and lift. Many people have less discomfortwhen lifting if they bend their knees, keep the load close to their bodies,and avoid twisting. Often, the most comfortable positions are those that put less stress on your recovering back.  Find a comfortable position to sleep. Use a firm mattress and lie on your side with your knees slightly bent. If you lie on your back, put a pillow under your knees.  Only take over-the-counter or prescription medicines as directed by your caregiver. Over-the-counter medicines to reduce pain and inflammation are often the most helpful.Your caregiver may prescribe muscle relaxant drugs.These medicines help dull your pain so you can more quickly return to your normal activities and healthy exercise.  Put ice on the injured area.  Put ice in a plastic bag.  Place a towel between your skin and the bag.  Leave the ice on for 15-20 minutes,  03-04 times a day for the first 2 to 3 days. After that, ice and heat may be alternated to reduce pain and spasms.  Ask your caregiver about trying back exercises and gentle massage. This may be of some benefit.  Avoid feeling anxious or stressed.Stress increases muscle tension and can worsen back pain.It is important to recognize when you are anxious or stressed and learn ways to manage it.Exercise is a great option. SEEK MEDICAL CARE IF:  You have pain that is not relieved with rest or medicine.  You have pain that does not improve in 1 week.  You have new symptoms.  You are generally not feeling well. SEEK IMMEDIATE MEDICAL CARE IF:   You have pain that radiates from your back into your legs.  You develop new bowel or bladder control problems.  You have unusual weakness or numbness in your arms or legs.  You develop nausea or vomiting.  You develop abdominal pain.  You feel faint. Document Released: 08/09/2005 Document Revised: 02/08/2012 Document  Reviewed: 12/11/2013 [facility] Patient Information 2015 [place], [place]. This information is not intended to replace advice given to you by your health care provider. Make sure you discuss any questions you have with your health care provider.

## 2015-03-09 NOTE — ED Notes (Signed)
Jana Half RN starting IV

## 2015-03-09 NOTE — ED Notes (Signed)
Per EMS, pt from  Home. Pt awakened in the night and went to get a snack.  Approx 4 am.  Pt found in kitchen supine on floor at 10:30 am.  ? Found by wife.  No head trauma noted.  No blood thinners.  Pt c/o lower back pain but spine cleared by EMS.  Pt does not recall event.  Sinus brady in route.  Pt at apt [facility].  Vitals: cbg 111, 186/73, hr 56, 100%ra.  Pt has not taken his bp meds yet today.

## 2015-03-09 NOTE — ED Provider Notes (Signed)
CSN: [id number]     Arrival date & time 03/09/15  1137 History   First MD Initiated Contact with Patient 03/09/15 1204     Chief Complaint  Patient presents with  . Loss of Consciousness  . Fall   Luis Barker is a 81 y.o. male coming from [facility] with a history of coronary artery disease, dizziness, chronic kidney disease and jaw cancer who presents to the emergency department after he fell last night while getting up to take some ibuprofen. Patient reports he was diagnosed with jaw cancer recently and has been taking ibuprofen for pain. He reports around 4 AM he woke up to take ibuprofen and fell to the floor. He does not remember the fall. He reports waking up on the floor around 10:30 this morning. His only complaint is 4 out of 10 low back pain.  He is not on anticoagulants. He is due for a CT scan of his jaw this coming week for further work up of his jaw cancer. The patient denies fevers, chills, recent illness, chest pain, shortness of breath, headache, numbness, tingling, weakness, abdominal pain, nausea, vomiting, double vision, rashes, lightheadedness, dizziness, or neck pain. He denies loss of bowel or bladder control.   (Consider location/radiation/quality/duration/timing/severity/associated sxs/prior Treatment) HPI  Past Medical History  Diagnosis Date  . Peripheral vascular disease, unspecified 08/01/2012  . Insomnia, unspecified 08/01/2012  . Disturbance of skin sensation 08/01/2012  . Occlusion and stenosis of carotid artery 05/16/2012  . Unspecified disorder of thyroid 03/28/2012  . Gout, unspecified 03/28/2012  . Other disorder of calcium metabolism 03/28/2012  . Pain in limb 03/28/2012  . Other and unspecified hyperlipidemia 03/27/2012  . Anemia, unspecified 03/27/2012  . Unspecified essential hypertension 03/27/2012  . Postinflammatory pulmonary fibrosis 03/27/2012  . Chronic kidney disease, stage III (moderate) 03/27/2012  . Orthostatic hypotension 10/27/2009  .  Dizziness and giddiness 10/23/2009  . Other and unspecified angina pectoris 05/14/2009  . Coronary atherosclerosis of native coronary artery 03/28/1993  . Calculus of kidney 03/29/1979  . Hip joint replacement by other means 03/29/1979  . Precordial pain 03/27/2012  . Bursitis, prepatellar 02/04/2012  . Carotid artery stenosis   . GERD (gastroesophageal reflux disease)   . H/O syncope   . Nephrolithiasis   . Congenital nasal septum deviation   . Rhinorrhea 11/06/2013  . Cancer     Jaw   Past Surgical History  Procedure Laterality Date  . Total hip arthroplasty Left 1980     Dr. Lorin Mercy  . Coronary angioplasty with stent placement  05/16/2009    Dr Wynonia Lawman  . Coronary artery bypass graft  1994  . Cataract extraction w/ intraocular lens  implant, bilateral Bilateral 07/02/2014    Dr. Lucita Ferrara   Family History  Problem Relation Age of Onset  . Stroke Mother   . Heart disease Father   . Dementia Brother    History  Substance Use Topics  . Smoking status: Former Smoker    Quit date: 12/27/1982  . Smokeless tobacco: Never Used  . Alcohol Use: No    Review of Systems  Constitutional: Negative for fever and chills.  HENT: Negative for congestion and sore throat.   Eyes: Negative for visual disturbance.  Respiratory: Negative for cough and shortness of breath.   Cardiovascular: Negative for chest pain and palpitations.  Gastrointestinal: Negative for nausea, vomiting and abdominal pain.  Genitourinary: Negative for dysuria, frequency, hematuria and difficulty urinating.  Musculoskeletal: Positive for back pain. Negative for neck pain.  Skin: Negative for rash and wound.  Neurological: Negative for dizziness, seizures, syncope, speech difficulty, weakness, light-headedness, numbness and headaches.      Allergies  Sulfa antibiotics  Home Medications   Prior to Admission medications   Medication Sig Start Date End Date Taking? Authorizing Provider  aspirin 81 MG tablet Take 81 mg  by mouth daily.   Yes Historical Provider, MD  atorvastatin (LIPITOR) 80 MG tablet TAKE 1/2 A TABLET BY MOUTH DAILY TO CONTROL CHOLESTEROL Patient taking differently: TAKE 1/2 A TABLET BY MOUTH DAILY TO CONTROL CHOLESTEROL AM 01/17/15  Yes Estill Dooms, MD  chlorhexidine (PERIDEX) 0.12 % solution Swish and spit 1/2 oz for 30 seconds twice a day 12/25/14  Yes Historical Provider, MD  Multiple Vitamins-Minerals ([employer] SPECTRAVITE) TABS Take 1 tablet by mouth. Daily for vitamin   Yes Historical Provider, MD  oxymetazoline (NASAL SPRAY 12 HOUR) 0.05 % nasal spray Place 1 spray into both nostrils 2 (two) times daily.   Yes Historical Provider, MD  traZODone (DESYREL) 100 MG tablet TAKE 1 TABLET AT BEDTIME FOR REST Patient taking differently: TAKE .5- 1 TABLET AT BEDTIME FOR REST 12/12/14  Yes Estill Dooms, MD  traMADol (ULTRAM) 50 MG tablet Take 1 tablet (50 mg total) by mouth every 12 (twelve) hours as needed for moderate pain or severe pain. 03/09/15   Waynetta Pean, PA-C   BP 171/62 mmHg  Pulse 58  Temp(Src) 97.9 F (36.6 C) (Oral)  Resp 24  SpO2 100% Physical Exam  Constitutional: He is oriented to person, place, and time. He appears well-developed and well-nourished. No distress.  Nontoxic appearing.  HENT:  Head: Normocephalic and atraumatic.  Right Ear: External ear normal.  Left Ear: External ear normal.  Mouth/Throat: Oropharynx is clear and moist. No oropharyngeal exudate.  Eyes: Conjunctivae and EOM are normal. Pupils are equal, round, and reactive to light. Right eye exhibits no discharge. Left eye exhibits no discharge.  Neck: Normal range of motion. Neck supple. No JVD present. No tracheal deviation present.  No midline neck tenderness.  Cardiovascular: Regular rhythm, normal heart sounds and intact distal pulses.  Exam reveals no gallop and no friction rub.   No murmur heard. Heart rate is 58. Bilateral radial, posterior tibialis and dorsalis pedis pulses are intact.     Pulmonary/Chest: Effort normal and breath sounds normal. No respiratory distress. He has no wheezes. He has no rales. He exhibits no tenderness.  Lungs are clear to auscultation bilaterally.  Abdominal: Soft. Bowel sounds are normal. He exhibits no distension. There is no tenderness. There is no guarding.  Abdomen is soft and nontender to palpation.  Musculoskeletal: He exhibits tenderness. He exhibits no edema.  Patient is spontaneously moving all extremities in a coordinated fashion exhibiting good strength. The patient has mild bilateral low back tenderness to palpation. No back edema, erythema, ecchymosis or deformity. No pelvic instability. No pain with manipulation of his lower legs.  Lymphadenopathy:    He has no cervical adenopathy.  Neurological: He is alert and oriented to person, place, and time. No cranial nerve deficit. Coordination normal.  The patient is alert and oriented 3. Cranial nerves are intact. No pronator drift. Finger to nose intact bilaterally. Sensation is intact in his bilateral upper and lower extremities.  Skin: Skin is warm and dry. No rash noted. He is not diaphoretic. No erythema. No pallor.  Psychiatric: He has a normal mood and affect. His behavior is normal.  Nursing note and vitals reviewed.  ED Course  Procedures (including critical care time) Labs Review Labs Reviewed  BASIC METABOLIC PANEL - Abnormal; Notable for the following:    BUN 33 (*)    Creatinine, Ser 1.93 (*)    GFR calc non Af Amer 30 (*)    GFR calc Af Amer 34 (*)    All other components within normal limits  CBC - Abnormal; Notable for the following:    WBC 14.4 (*)    RBC 3.63 (*)    Hemoglobin 11.3 (*)    HCT 34.3 (*)    All other components within normal limits  URINALYSIS, ROUTINE W REFLEX MICROSCOPIC (NOT AT [facility]) - Abnormal; Notable for the following:    Hgb urine dipstick SMALL (*)    All other components within normal limits  URINE MICROSCOPIC-ADD ON  CBG MONITORING,  ED  Randolm Idol, ED    Imaging Review Dg Lumbar Spine Complete  03/09/2015   CLINICAL DATA:  Status post fall. Left greater than right low back pain  EXAM: LUMBAR SPINE - COMPLETE 4+ VIEW  COMPARISON:  None.  FINDINGS: There are 5 nonrib bearing lumbar-type vertebral bodies.  There is generalized osteopenia. The vertebral body heights are maintained.  There is no spondylolysis. There is 2 mm of retrolisthesis of L1 on L2, L2 on L3 and L3 on L4.  There is no acute fracture.  There is degenerative disc disease throughout the lumbar spine most severe at L5-S1. There is bilateral facet arthropathy of the lumbar spine most severe at L5-S1.  The SI joints are unremarkable.  There is abdominal aortic atherosclerosis. There is mild aneurysmal dilatation of the infrarenal abdominal aorta measuring approximately 3 cm in AP diameter.  IMPRESSION: 1. No acute osseous injury of the lumbar spine. 2. Mild aneurysmal dilatation of the infrarenal abdominal aorta measuring 3 cm.   Electronically Signed   By: Kathreen Devoid   On: 03/09/2015 13:12   Ct Head Wo Contrast  03/09/2015   CLINICAL DATA:  Pt awakened in the night and went to get a snack. Approx 4 am. Pt found in kitchen supine on floor at 10:30 am. Found by wife. No head trauma noted. No blood thinners  EXAM: CT HEAD WITHOUT CONTRAST  TECHNIQUE: Contiguous axial images were obtained from the base of the skull through the vertex without intravenous contrast.  COMPARISON:  None.  FINDINGS: There is no evidence of mass effect, midline shift, or extra-axial fluid collections. There is no evidence of a space-occupying lesion or intracranial hemorrhage. There is no evidence of a cortical-based area of acute infarction. 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 There is generalized cerebral atrophy. There is periventricular white matter low attenuation likely secondary to microangiopathy.  The ventricles and sulci are appropriate for the patient's age. The basal  cisterns are patent.  Visualized portions of the orbits are unremarkable. The visualized portions of the paranasal sinuses and mastoid air cells are unremarkable. Cerebrovascular atherosclerotic calcifications are noted.  The osseous structures are unremarkable.  IMPRESSION: 1. No acute intracranial pathology. 2. Chronic microvascular disease and cerebral atrophy.   Electronically Signed   By: Kathreen Devoid   On: 03/09/2015 12:59     EKG Interpretation   Date/Time:  Sunday March 09 2015 12:23:12 EDT Ventricular Rate:  109 PR Interval:    QRS Duration: 91 QT Interval:  348 QTC Calculation: 469 R Axis:   -10 Text Interpretation:  artifact, likely sinus rythym Left ventricular  hypertrophy Confirmed by Wilson Singer  MD, STEPHEN [phone]) on 03/09/2015 1:33:49 PM      Filed Vitals:   03/09/15 1146 03/09/15 1338  BP: 193/64 171/62  Pulse: 58 58  Temp: 97.9 F (36.6 C) 97.9 F (36.6 C)  TempSrc: Oral Oral  Resp: 16 24  SpO2: 100% 100%     MDM   Meds given in ED:  Medications  traMADol (ULTRAM) tablet 50 mg (not administered)  sodium chloride 0.9 % bolus 500 mL (500 mLs Intravenous New Bag/Given 03/09/15 1437)  acetaminophen (TYLENOL) tablet 500 mg (500 mg Oral Given 03/09/15 1437)    New Prescriptions   TRAMADOL (ULTRAM) 50 MG TABLET    Take 1 tablet (50 mg total) by mouth every 12 (twelve) hours as needed for moderate pain or severe pain.    Final diagnoses:  Fall, initial encounter  Bilateral low back pain without sciatica    This is a 81 y.o. male coming from [facility] with a history of coronary artery disease, dizziness, chronic kidney disease and jaw cancer who presents to the emergency department after he fell last night while getting up to take some ibuprofen. Patient reports he was diagnosed with jaw cancer recently and has been taking ibuprofen for pain. He reports around 4 AM he woke up to take ibuprofen and fell to the floor. He does not remember the fall. He reports  waking up on the floor around 10:30 this morning. His only complaint is 4 out of 10 low back pain. On exam the patient is afebrile and nontoxic appearing. He has no focal neurological deficits. He has bilateral low back tenderness to palpation without back edema, deformity, ecchymosis or erythema. Urinalysis is nitrite and leukocyte negative. His troponin is negative. BMP indicates a creatinine of 1.93 which is around his baseline with his most recent blood work. I encouraged the patient to stop taking ibuprofen. CBC shows a mild acidosis with a white count of 14.4. Hemoglobin is stable at 11.3. Plain films of his lumbar spine are negative for acute osseous abnormality. Does indicate in aneurysmal dilation of the infrarenal abdominal aorta measuring approximately 3 cm. Patient's abdomen is soft and nontender to palpation. He is hemodynamically stable. CT of his head without contrast indicates no acute intracranial abnormality. At reevaluation the patient's only complaint is still some low back pain. Provide the patient with tramadol. I once again encouraged him not to take ibuprofen and will provide him with a prescription for tramadol every 12 hours for his pain due to his creatinine clearance. The patient ambulated in the room well with some assistance. He feels comfortable being discharged. I advised the patient to follow-up with their primary care provider this week. I advised the patient to return to the emergency department with new or worsening symptoms or new concerns. The patient and his daughter verbalized understanding and agreement with plan.     This patient was discussed with and evaluated by Dr. Wilson Singer who agrees with assessment and plan.   Waynetta Pean, PA-C 03/09/15 [place], MD 03/16/15 [phone]

## 2015-03-09 NOTE — ED Notes (Signed)
Bed: OM85 Expected date:  Expected time:  Means of arrival:  Comments: EMS-syncopal episode

## 2015-04-24 ENCOUNTER — Encounter (HOSPITAL_COMMUNITY): Payer: Self-pay | Admitting: Emergency Medicine

## 2015-04-24 ENCOUNTER — Emergency Department (HOSPITAL_COMMUNITY)
Admission: EM | Admit: 2015-04-24 | Discharge: 2015-05-24 | Disposition: E | Payer: PPO | Attending: Emergency Medicine | Admitting: Emergency Medicine

## 2015-04-24 DIAGNOSIS — G4700 Insomnia, unspecified: Secondary | ICD-10-CM | POA: Diagnosis not present

## 2015-04-24 DIAGNOSIS — Z9861 Coronary angioplasty status: Secondary | ICD-10-CM | POA: Insufficient documentation

## 2015-04-24 DIAGNOSIS — Q674 Other congenital deformities of skull, face and jaw: Secondary | ICD-10-CM | POA: Diagnosis not present

## 2015-04-24 DIAGNOSIS — Z87442 Personal history of urinary calculi: Secondary | ICD-10-CM | POA: Insufficient documentation

## 2015-04-24 DIAGNOSIS — I469 Cardiac arrest, cause unspecified: Secondary | ICD-10-CM | POA: Diagnosis present

## 2015-04-24 DIAGNOSIS — Z7982 Long term (current) use of aspirin: Secondary | ICD-10-CM | POA: Insufficient documentation

## 2015-04-24 DIAGNOSIS — E785 Hyperlipidemia, unspecified: Secondary | ICD-10-CM | POA: Diagnosis not present

## 2015-04-24 DIAGNOSIS — I129 Hypertensive chronic kidney disease with stage 1 through stage 4 chronic kidney disease, or unspecified chronic kidney disease: Secondary | ICD-10-CM | POA: Insufficient documentation

## 2015-04-24 DIAGNOSIS — Z79899 Other long term (current) drug therapy: Secondary | ICD-10-CM | POA: Insufficient documentation

## 2015-04-24 DIAGNOSIS — N183 Chronic kidney disease, stage 3 (moderate): Secondary | ICD-10-CM | POA: Diagnosis not present

## 2015-04-24 DIAGNOSIS — Z87891 Personal history of nicotine dependence: Secondary | ICD-10-CM | POA: Diagnosis not present

## 2015-04-24 DIAGNOSIS — Z85828 Personal history of other malignant neoplasm of skin: Secondary | ICD-10-CM | POA: Insufficient documentation

## 2015-04-24 DIAGNOSIS — Z8719 Personal history of other diseases of the digestive system: Secondary | ICD-10-CM | POA: Diagnosis not present

## 2015-04-24 DIAGNOSIS — Z951 Presence of aortocoronary bypass graft: Secondary | ICD-10-CM | POA: Diagnosis not present

## 2015-04-24 DIAGNOSIS — I25118 Atherosclerotic heart disease of native coronary artery with other forms of angina pectoris: Secondary | ICD-10-CM | POA: Diagnosis not present

## 2015-05-24 NOTE — ED Provider Notes (Signed)
CSN: [id number]     Arrival date & time 05/15/2015  0617 History   First MD Initiated Contact with Patient 05/16/2015 [phone]     Chief Complaint  Patient presents with  . Cardiac Arrest     (Consider location/radiation/quality/duration/timing/severity/associated sxs/prior Treatment) HPI 81 year old male presents to the emergency department via [facility] facility with cardiac arrest.  Patient initially with shortness of breath, but when out neck.  CPR started by staff.  EMS reports greater than 9 A of epi.  Brief return of pulses with epi drip started.  Pulses lost twice in between.  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  Patient has significant cardiac history.  Bright red blood in the [facility] airway. Past Medical History  Diagnosis Date  . Peripheral vascular disease, unspecified 08/01/2012  . Insomnia, unspecified 08/01/2012  . Disturbance of skin sensation 08/01/2012  . Occlusion and stenosis of carotid artery 05/16/2012  . Unspecified disorder of thyroid 03/28/2012  . Gout, unspecified 03/28/2012  . Other disorder of calcium metabolism 03/28/2012  . Pain in limb 03/28/2012  . Other and unspecified hyperlipidemia 03/27/2012  . Anemia, unspecified 03/27/2012  . Unspecified essential hypertension 03/27/2012  . Postinflammatory pulmonary fibrosis 03/27/2012  . Chronic kidney disease, stage III (moderate) 03/27/2012  . Orthostatic hypotension 10/27/2009  . Dizziness and giddiness 10/23/2009  . Other and unspecified angina pectoris 05/14/2009  . Coronary atherosclerosis of native coronary artery 03/28/1993  . Calculus of kidney 03/29/1979  . Hip joint replacement by other means 03/29/1979  . Precordial pain 03/27/2012  . Bursitis, prepatellar 02/04/2012  . Carotid artery stenosis   . GERD (gastroesophageal reflux disease)   . H/O syncope   . Nephrolithiasis   . Congenital nasal septum deviation   . Rhinorrhea 11/06/2013  . Cancer    Jaw   Past Surgical History  Procedure Laterality Date  . Total hip arthroplasty Left 1980     Dr. Lorin Mercy  . Coronary angioplasty with stent placement  05/16/2009    Dr Wynonia Lawman  . Coronary artery bypass graft  1994  . Cataract extraction w/ intraocular lens  implant, bilateral Bilateral 07/02/2014    Dr. Lucita Ferrara   Family History  Problem Relation Age of Onset  . Stroke Mother   . Heart disease Father   . Dementia Brother    Social History  Substance Use Topics  . Smoking status: Former Smoker    Quit date: 12/27/1982  . Smokeless tobacco: Never Used  . Alcohol Use: No    Review of Systems  Level V caveat, cardiac arrest  Allergies  Sulfa antibiotics  Home Medications   Prior to Admission medications   Medication Sig Start Date End Date Taking? Authorizing Provider  aspirin 81 MG tablet Take 81 mg by mouth daily.    Historical Provider, MD  atorvastatin (LIPITOR) 80 MG tablet TAKE 1/2 A TABLET BY MOUTH DAILY TO CONTROL CHOLESTEROL Patient taking differently: TAKE 1/2 A TABLET BY MOUTH DAILY TO CONTROL CHOLESTEROL AM 01/17/15   Estill Dooms, MD  chlorhexidine (PERIDEX) 0.12 % solution Swish and spit 1/2 oz for 30 seconds twice a day 12/25/14   Historical Provider, MD  Multiple Vitamins-Minerals ([employer] SPECTRAVITE) TABS Take 1 tablet by mouth. Daily for vitamin    Historical Provider, MD  oxymetazoline (NASAL SPRAY 12 HOUR) 0.05 % nasal spray Place 1 spray into both nostrils 2 (two) times daily.    Historical Provider, MD  traMADol (  ULTRAM) 50 MG tablet Take 1 tablet (50 mg total) by mouth every 12 (twelve) hours as needed for moderate pain or severe pain. 03/09/15   Waynetta Pean, PA-C  traZODone (DESYREL) 100 MG tablet TAKE 1 TABLET AT BEDTIME FOR REST Patient taking differently: TAKE .5- 1 TABLET AT BEDTIME FOR REST 12/12/14   Estill Dooms, MD   Wt 150 lb (68.04 kg) Physical Exam  Constitutional:  Pale, apneic, unresponsive.  Lucas device in place.  King airway in  place  Cardiovascular:  Absent heart tones  Pulmonary/Chest:  Breath sounds only with bagging  Abdominal:  G-tube in place  Skin: There is pallor.    ED Course  Procedures (including critical care time) Labs Review Labs Reviewed - No data to display  Imaging Review No results found. I have personally reviewed and evaluated these images and lab results as part of my medical decision-making.   EKG Interpretation None      Cardiopulmonary Resuscitation (CPR) Procedure Note Directed/Performed by: Kalman Drape I personally directed ancillary staff and/or performed CPR in an effort to regain return of spontaneous circulation and to maintain cardiac, neuro and systemic perfusion.     EMERGENCY DEPARTMENT [place] CARDIAC EXAM "Study: Limited Ultrasound of the heart and pericardium"  INDICATIONS:Cardiac arrest Multiple views of the heart and pericardium are obtained with a multi-frequency probe.  PERFORMED WS:FKCLEX  IMAGES ARCHIVED?: No  FINDINGS: No pericardial effusion  LIMITATIONS:  Emergent procedure  VIEWS USED: Subcostal 4 chamber, Parasternal long axis, Parasternal short axis and Apical 4 chamber   INTERPRETATION: Cardiac activity absent  COMMENT:  Cardiac arrest   MDM   Final diagnoses:  Cardiac arrest   Code called at 620, time of death 14   7:08 AM Case d/w Dr Eulas Post with [facility].  Death Certificate to be sent to their office.  Family updated, not unexpected death.  Linton Flemings, MD 04/30/15 [phone]

## 2015-05-24 NOTE — Progress Notes (Signed)
Chaplain responded toa request from the ED for a post CPR transported from a [facility]. The patient was unresponsive at the time of arrival, and the medical team was providing a medical intervention to no avail and the patient died.  The family was notified and reported to the ED and were informed of the patient's death.  Chaplain provided grief support, comfort measures and informed of all needed information for Nursing Staff for end of life process. The family was appreciative of all support. S[place]ad [phone]

## 2015-05-24 NOTE — ED Notes (Signed)
Patient was found by staff at [facility] with shortness of breath, then he stopped, CPR started by staff.  EMS arrived, patient was given 2-3 epis, had return of ROSC for approximately 10-12 mins.  Patient lost pulses upon movement to ambulance.  Upon arrival to ED, patient was pale, cool to the touch, king airway was placed with copious amount of blood from airway.  Patient was recently discharged from this facility after neck surgery.

## 2015-05-24 NOTE — Progress Notes (Signed)
RT assisted with manual bagging of patient until code called.

## 2015-05-24 DEATH — deceased

## 2015-10-25 IMAGING — CR DG LUMBAR SPINE COMPLETE 4+V
5 series · 5 of 5 positions shown · non-contrast
Comparison: None.

CLINICAL DATA: Status post fall. Left greater than right low back
pain

EXAM:
LUMBAR SPINE - COMPLETE 4+ VIEW

[t lumbar spine ap]
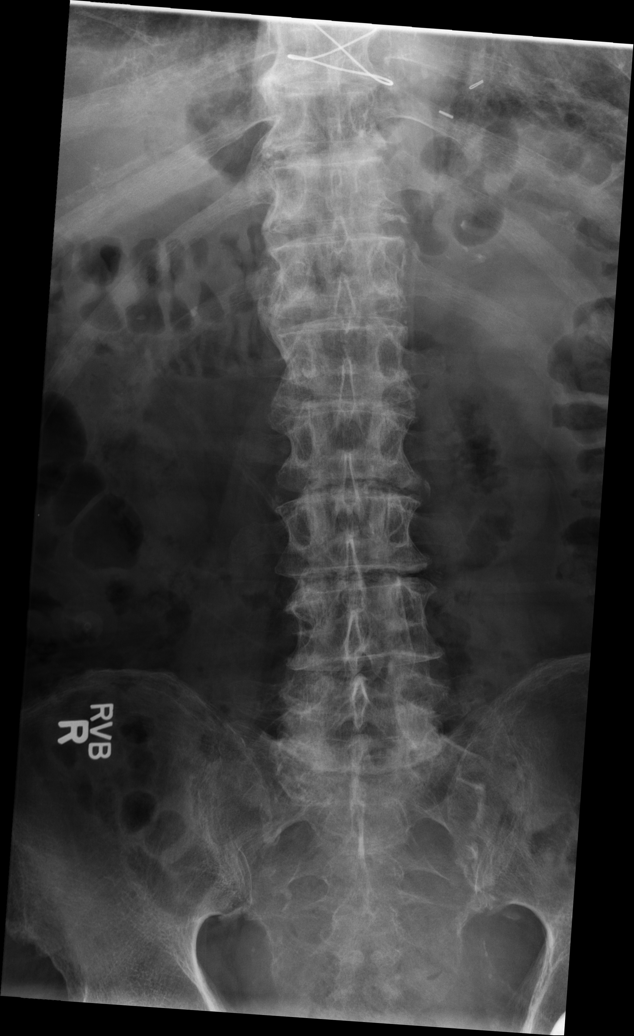

[t lumbar spine obl (1 of 2)]
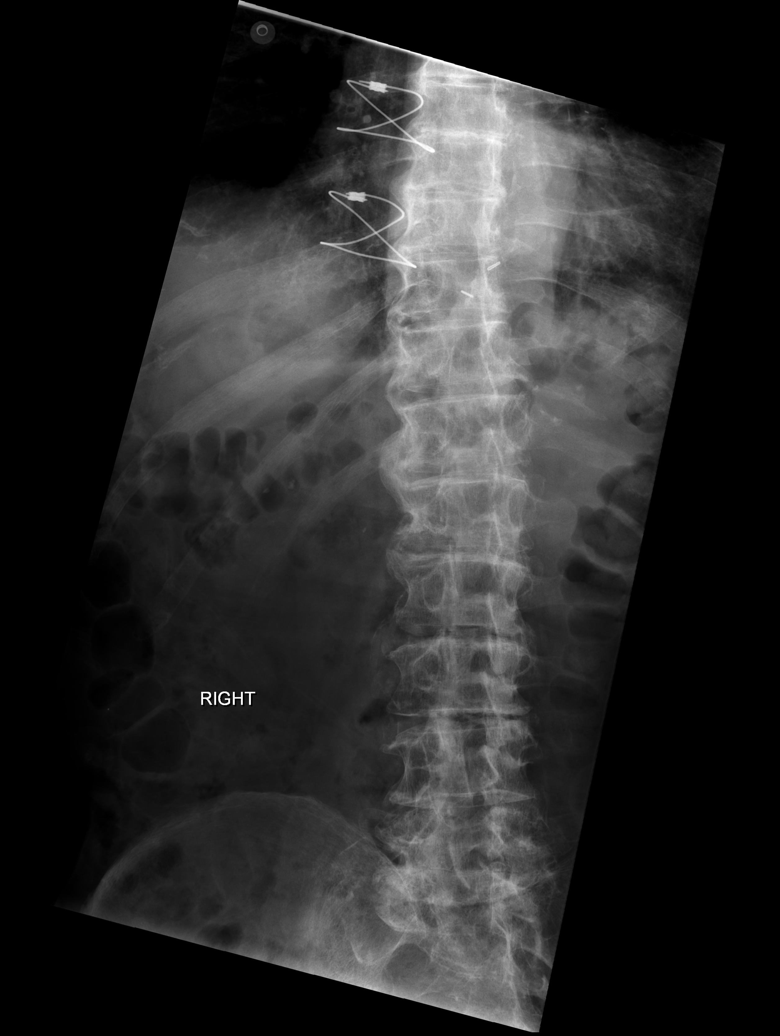

[t lumbar spine obl (2 of 2)]
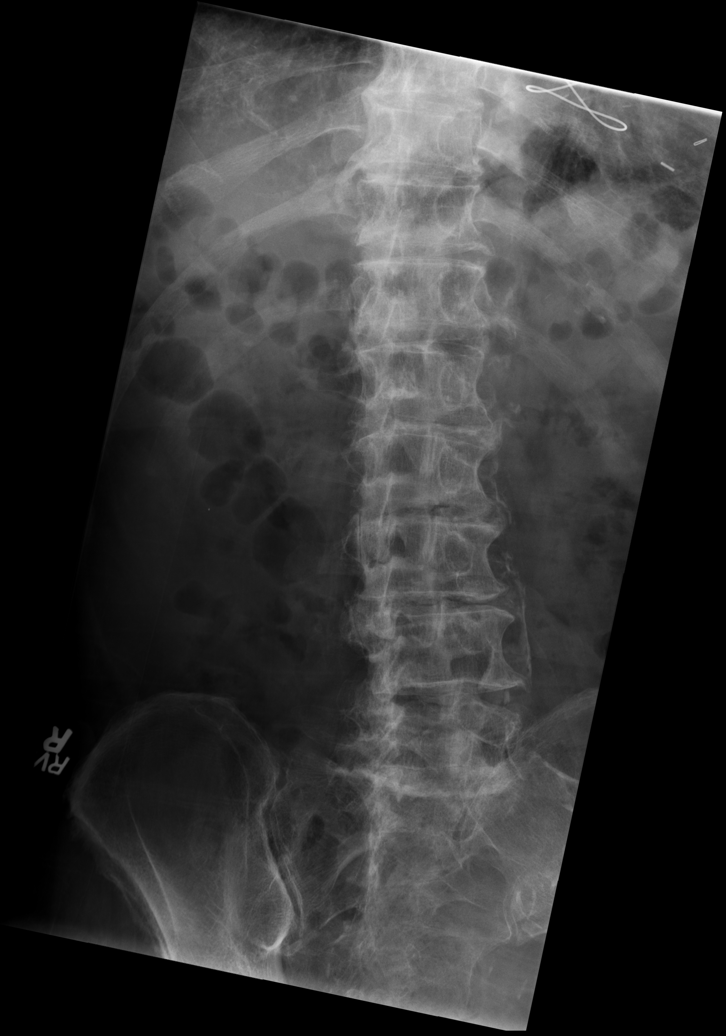

[t lumbar spine lat]
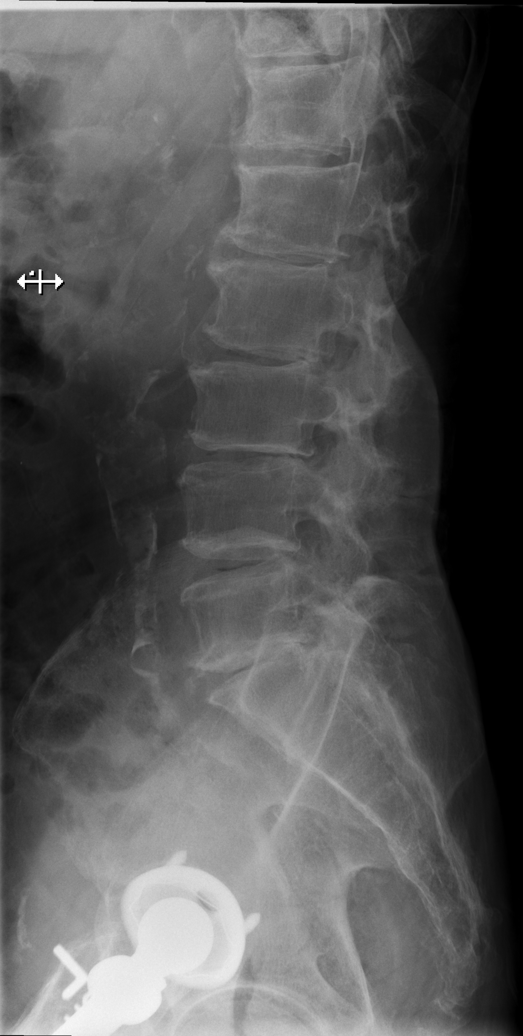

[t lumbar l-5 s-1 spot]
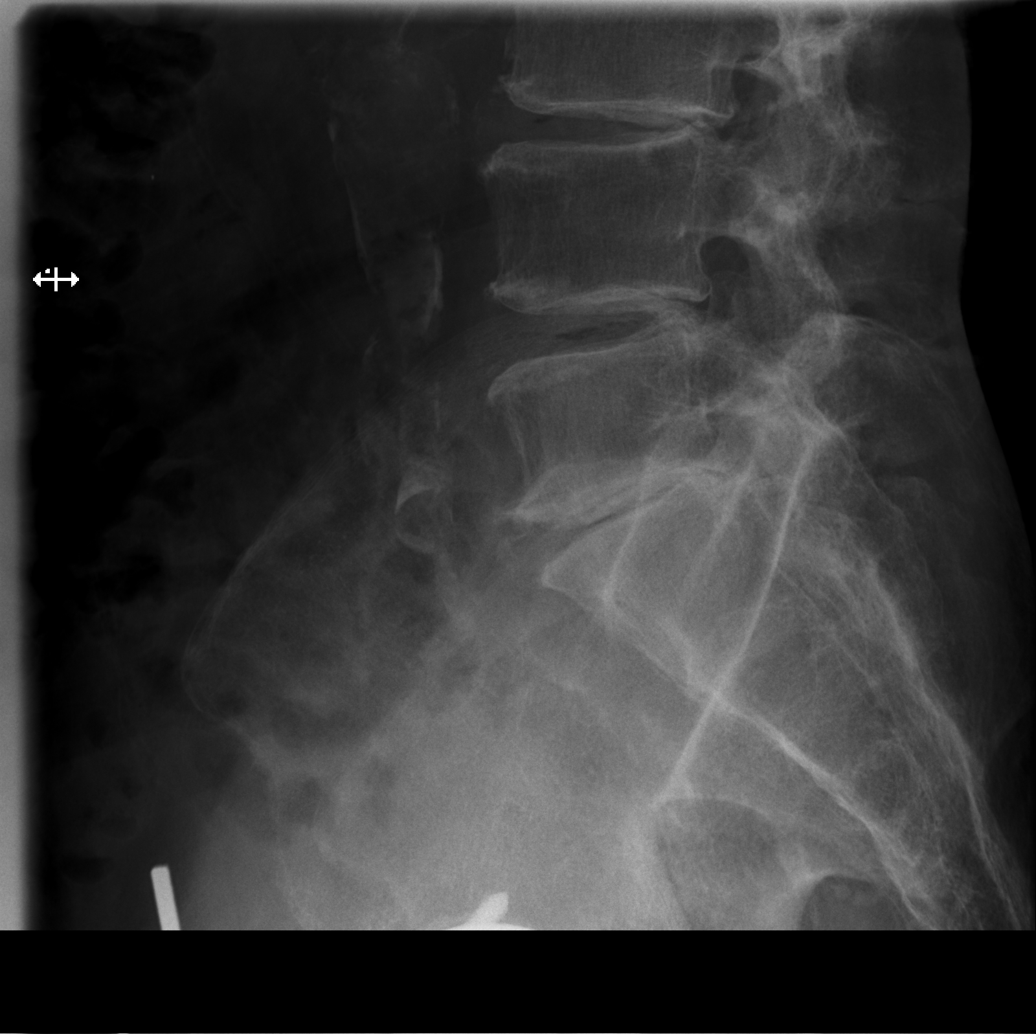

[5 of 5 positions shown; findings below may reference images not displayed]

FINDINGS: There are 5 nonrib bearing lumbar-type vertebral bodies.

There is generalized osteopenia. The vertebral body heights are
maintained.

There is no spondylolysis. There is 2 mm of retrolisthesis of L1 on
L2, L2 on L3 and L3 on L4.

There is no acute fracture.

There is degenerative disc disease throughout the lumbar spine most
severe at L5-S1. There is bilateral facet arthropathy of the lumbar
spine most severe at L5-S1.

The SI joints are unremarkable.

There is abdominal aortic atherosclerosis. There is mild aneurysmal
dilatation of the infrarenal abdominal aorta measuring approximately
3 cm in AP diameter.
IMPRESSION: 1. No acute osseous injury of the lumbar spine.
2. Mild aneurysmal dilatation of the infrarenal abdominal aorta
measuring 3 cm.
